# Patient Record
Sex: Female | Born: 1945
Health system: Southern US, Community
[De-identification: ages and names within clinical notes are randomized; demographics above are authoritative.]

## PROBLEM LIST (undated history)

## (undated) DIAGNOSIS — M1990 Unspecified osteoarthritis, unspecified site: Secondary | ICD-10-CM

## (undated) DIAGNOSIS — I10 Essential (primary) hypertension: Secondary | ICD-10-CM

## (undated) DIAGNOSIS — E785 Hyperlipidemia, unspecified: Secondary | ICD-10-CM

## (undated) DIAGNOSIS — J45909 Unspecified asthma, uncomplicated: Secondary | ICD-10-CM

## (undated) HISTORY — PX: CHOLECYSTECTOMY: SHX55

## (undated) HISTORY — PX: ABDOMINAL HYSTERECTOMY: SHX81

---

## 2005-02-20 ENCOUNTER — Encounter: Payer: Self-pay | Admitting: General Practice

## 2005-03-04 ENCOUNTER — Encounter: Payer: Self-pay | Admitting: General Practice

## 2005-03-13 ENCOUNTER — Ambulatory Visit: Payer: Self-pay

## 2005-04-03 ENCOUNTER — Encounter: Payer: Self-pay | Admitting: General Practice

## 2006-05-18 ENCOUNTER — Ambulatory Visit: Payer: Self-pay

## 2007-08-01 ENCOUNTER — Ambulatory Visit: Payer: Self-pay

## 2008-12-09 ENCOUNTER — Ambulatory Visit: Payer: Self-pay

## 2009-12-21 ENCOUNTER — Ambulatory Visit: Payer: Self-pay

## 2010-12-22 ENCOUNTER — Ambulatory Visit: Payer: Self-pay

## 2012-01-12 ENCOUNTER — Ambulatory Visit: Payer: Self-pay | Admitting: Internal Medicine

## 2013-01-15 ENCOUNTER — Ambulatory Visit: Payer: Self-pay | Admitting: Internal Medicine

## 2013-01-21 ENCOUNTER — Ambulatory Visit: Payer: Self-pay | Admitting: Internal Medicine

## 2014-01-21 ENCOUNTER — Ambulatory Visit: Payer: Self-pay | Admitting: Internal Medicine

## 2015-01-25 ENCOUNTER — Ambulatory Visit: Payer: Self-pay | Admitting: Internal Medicine

## 2016-01-06 DIAGNOSIS — I10 Essential (primary) hypertension: Secondary | ICD-10-CM | POA: Diagnosis not present

## 2016-01-13 ENCOUNTER — Other Ambulatory Visit: Payer: Self-pay | Admitting: Internal Medicine

## 2016-01-13 DIAGNOSIS — Z1231 Encounter for screening mammogram for malignant neoplasm of breast: Secondary | ICD-10-CM

## 2016-02-02 ENCOUNTER — Ambulatory Visit
Admission: RE | Admit: 2016-02-02 | Discharge: 2016-02-02 | Disposition: A | Discharge status: Home / Self Care | Payer: 59 | Source: Ambulatory Visit | Attending: Internal Medicine | Admitting: Internal Medicine

## 2016-02-02 ENCOUNTER — Other Ambulatory Visit: Payer: Self-pay | Admitting: Internal Medicine

## 2016-02-02 DIAGNOSIS — Z1231 Encounter for screening mammogram for malignant neoplasm of breast: Secondary | ICD-10-CM

## 2016-08-17 DIAGNOSIS — I119 Hypertensive heart disease without heart failure: Secondary | ICD-10-CM | POA: Diagnosis not present

## 2016-08-17 DIAGNOSIS — N3000 Acute cystitis without hematuria: Secondary | ICD-10-CM | POA: Diagnosis not present

## 2016-09-28 ENCOUNTER — Ambulatory Visit: Payer: Self-pay | Admitting: Physician Assistant

## 2016-09-28 ENCOUNTER — Encounter: Payer: Self-pay | Admitting: Physician Assistant

## 2016-09-28 VITALS — BP 178/80 | HR 72 | Temp 98.3°F

## 2016-09-28 DIAGNOSIS — J069 Acute upper respiratory infection, unspecified: Secondary | ICD-10-CM

## 2016-09-28 MED ORDER — AZITHROMYCIN 250 MG PO TABS: ORAL_TABLET | ORAL | 0 refills | Status: DC

## 2016-09-28 MED ORDER — FLUTICASONE PROPIONATE 50 MCG/ACT NA SUSP: 2.0000 | Freq: Every day | NASAL | 6 refills | Status: DC

## 2016-09-28 MED ORDER — ALBUTEROL SULFATE HFA 108 (90 BASE) MCG/ACT IN AERS: 2.0000 | INHALATION_SPRAY | Freq: Four times a day (QID) | RESPIRATORY_TRACT | 0 refills | Status: DC | PRN

## 2016-09-28 NOTE — Progress Notes (Signed)
S: C/o cough and congestion for 2 days, no fever, chills, cp/sob, v/d; mucus was white and thick , cough is sporadic, states last time she ended up on inhaler and flonase  Using otc meds: robitussin  O: PE: vitals w elevated bp, perrl eomi, normocephalic, tms dull, nasal mucosa red and swollen, throat injected, neck supple no lymph, lungs c t a, cv rrr, neuro intact, cough is dry  A:  Acute uri   P: drink fluids, continue regular meds , use otc meds of choice, return if not improving in 5 days, return earlier if worsening , albuterol inhaler, flonase, if worsening over the weekend use zpack

## 2017-01-11 DIAGNOSIS — I119 Hypertensive heart disease without heart failure: Secondary | ICD-10-CM | POA: Diagnosis not present

## 2017-01-11 DIAGNOSIS — E785 Hyperlipidemia, unspecified: Secondary | ICD-10-CM | POA: Diagnosis not present

## 2017-01-23 ENCOUNTER — Other Ambulatory Visit: Payer: Self-pay | Admitting: Internal Medicine

## 2017-01-23 DIAGNOSIS — Z1231 Encounter for screening mammogram for malignant neoplasm of breast: Secondary | ICD-10-CM

## 2017-02-21 ENCOUNTER — Encounter (HOSPITAL_COMMUNITY): Payer: Self-pay

## 2017-02-21 ENCOUNTER — Ambulatory Visit
Admission: RE | Admit: 2017-02-21 | Discharge: 2017-02-21 | Disposition: A | Discharge status: Home / Self Care | Payer: 59 | Source: Ambulatory Visit | Attending: Internal Medicine | Admitting: Internal Medicine

## 2017-02-21 DIAGNOSIS — Z1231 Encounter for screening mammogram for malignant neoplasm of breast: Secondary | ICD-10-CM | POA: Insufficient documentation

## 2017-07-01 ENCOUNTER — Emergency Department
Admission: EM | Admit: 2017-07-01 | Discharge: 2017-07-01 | Disposition: A | Discharge status: Home / Self Care | Payer: PRIVATE HEALTH INSURANCE | Attending: Emergency Medicine | Admitting: Emergency Medicine

## 2017-07-01 ENCOUNTER — Emergency Department: Payer: PRIVATE HEALTH INSURANCE

## 2017-07-01 ENCOUNTER — Encounter: Payer: Self-pay | Admitting: Emergency Medicine

## 2017-07-01 DIAGNOSIS — M25511 Pain in right shoulder: Secondary | ICD-10-CM | POA: Insufficient documentation

## 2017-07-01 DIAGNOSIS — M19011 Primary osteoarthritis, right shoulder: Secondary | ICD-10-CM | POA: Insufficient documentation

## 2017-07-01 DIAGNOSIS — Z79899 Other long term (current) drug therapy: Secondary | ICD-10-CM | POA: Diagnosis not present

## 2017-07-01 DIAGNOSIS — I10 Essential (primary) hypertension: Secondary | ICD-10-CM | POA: Diagnosis not present

## 2017-07-01 HISTORY — DX: Essential (primary) hypertension: I10

## 2017-07-01 HISTORY — DX: Unspecified osteoarthritis, unspecified site: M19.90

## 2017-07-01 MED ORDER — MELOXICAM 7.5 MG PO TABS: 7.5000 mg | ORAL_TABLET | Freq: Every day | ORAL | 0 refills | Status: DC

## 2017-07-01 NOTE — ED Notes (Signed)
[employer] Employer Profile printed and placed on chart; per profile, "[employer] testing is done for reasonable suspicion only."  This RN spoke with EVS Supervisor, Truddie Crumblelea Pulliam, he states no testing is necessary at this time.

## 2017-07-01 NOTE — Discharge Instructions (Signed)
Follow-up at the employee clinic or your primary care doctor if any continued problems. Begin taking meloxicam 7.5 mg one daily with food. Discontinue taking this medication if you have any stomach upset. You may use heat or ice to your shoulder as needed for comfort.

## 2017-07-01 NOTE — ED Triage Notes (Signed)
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

## 2017-07-01 NOTE — ED Provider Notes (Signed)
[facility] Emergency Department Provider Note  ____________________________________________   First MD Initiated Contact with Patient 07/01/17 1149     (approximate)  I have reviewed the triage vital signs and the nursing notes.   HISTORY  Chief Complaint Shoulder Pain    HPI Alyssa SlaterSallie L Baird is a 71 y.o. female is here with complaint of right shoulder pain. 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 She denies any previous known injury to her right shoulder. Patient is not taking any over-the-counter medication for her pain prior to examination. She denies any neck injuries or paresthesias into her right arm. Pain is increased with range of motion. Currently she rates her pain as 7/10.   Past Medical History:  Diagnosis Date  . Arthritis   . Hypertension     There are no active problems to display for this patient.   History reviewed. No pertinent surgical history.  Prior to Admission medications   Medication Sig Start Date End Date Taking? Authorizing Provider  albuterol (PROVENTIL HFA;VENTOLIN HFA) 108 (90 Base) MCG/ACT inhaler Inhale 2 puffs into the lungs every 6 (six) hours as needed for wheezing or shortness of breath. 09/28/16   Fisher, Roselyn BeringSusan W, PA-C  fluticasone (FLONASE) 50 MCG/ACT nasal spray Place 2 sprays into both nostrils daily. 09/28/16   Fisher, Roselyn BeringSusan W, PA-C  meloxicam (MOBIC) 7.5 MG tablet Take 1 tablet (7.5 mg total) by mouth daily. With food 07/01/17 07/01/18  Tommi RumpsSummers, Bridgitt Raggio L, PA-C  metoprolol succinate (TOPROL-XL) 100 MG 24 hr tablet Take 100 mg by mouth daily. Take with or immediately following a meal.    [provider]  metoprolol tartrate (LOPRESSOR) 25 MG tablet Take 25 mg by mouth 2 (two) times daily.    [provider]  valsartan (DIOVAN) 40 MG tablet Take 40 mg by mouth daily.    [provider]    Allergies Patient has no  allergy information on record.  Family History  Problem Relation Age of Onset  . Breast cancer Sister 1360    Social History Social History  Substance Use Topics  . Smoking status: Never Smoker  . Smokeless tobacco: Never Used  . Alcohol use No    Review of Systems Constitutional: No fever/chills Cardiovascular: Denies chest pain. Respiratory: Denies shortness of breath. Gastrointestinal:  No nausea, no vomiting.  Musculoskeletal: Positive right shoulder pain. Skin: Negative for rash. Neurological: Negative for headaches, focal weakness or numbness.   ____________________________________________   PHYSICAL EXAM:  VITAL SIGNS: ED Triage Vitals  Enc Vitals Group     BP 07/01/17 1127 (!) 167/60     Pulse Rate 07/01/17 1127 (!) 55     Resp 07/01/17 1127 18     Temp 07/01/17 1127 98.4 F (36.9 C)     Temp Source 07/01/17 1127 Oral     SpO2 07/01/17 1127 100 %     Weight 07/01/17 1128 178 lb (80.7 kg)     Height 07/01/17 1128 5\' 6"  (1.676 m)     Head Circumference --      Peak Flow --      Pain Score 07/01/17 1135 7     Pain Loc --      Pain Edu? --      Excl. in GC? --     Constitutional: Alert and oriented. Well appearing and in no acute distress. Eyes: Conjunctivae are normal. PERRL. EOMI. Head: Atraumatic. Neck: No stridor.  Cardiovascular: Normal rate, regular rhythm. Grossly normal heart sounds.  Good peripheral circulation. Respiratory: Normal respiratory effort.  No retractions. Lungs CTAB. Musculoskeletal: Examination of the right shoulder there is no gross deformity noted. There is minimal crepitus with range of motion and range of motion is minimally restricted with passive movement. No soft tissue swelling is appreciated. No point tenderness on palpation. Motor sensory function intact distally and capillary refill is less than 3 seconds. Neurologic:  Normal speech and language. No gross focal neurologic deficits are appreciated.  Skin:  Skin is warm, dry  and intact.  Psychiatric: Mood and affect are normal. Speech and behavior are normal.  ____________________________________________   LABS (all labs ordered are listed, but only abnormal results are displayed)  Labs Reviewed - No data to display  RADIOLOGY  Dg Shoulder Right  Result Date: 07/01/2017 CLINICAL DATA:  Anterior shoulder pain following lifting injury today. No previous relevant surgery. Initial encounter. EXAM: RIGHT SHOULDER - 2+ VIEW COMPARISON:  None. FINDINGS: The bones appear mildly demineralized. No evidence of acute fracture or dislocation. There are mild glenohumeral and acromioclavicular degenerative changes with probable fragmented spurring of the acromion. The subacromial space appears narrowed although is not optimally evaluated on these views. IMPRESSION: No acute osseous findings demonstrated. Suspected narrowing of the subacromial space as can be seen with rotator cuff impingement. Electronically Signed   By: Carey BullocksWilliam  Veazey M.D.   On: 07/01/2017 12:08    ____________________________________________   PROCEDURES  Procedure(s) performed: None  Procedures  Critical Care performed: No  ____________________________________________   INITIAL IMPRESSION / ASSESSMENT AND PLAN / ED COURSE  Pertinent labs & imaging results that were available during my care of the patient were reviewed by me and considered in my medical decision making (see chart for details).  Patient was placed on meloxicam 7.5 mg one daily with food. She is aware that this causes any GI upset she is to discontinue the medication immediately. She denies any previous GI difficulties other than reflux. Patient was given a note to return to work with limited use of her right arm the remainder of her shift. Patient states that she is off tomorrow. She will follow up with the employee clinic or her PCP if any continued problems.   ___________________________________________   FINAL CLINICAL  IMPRESSION(S) / ED DIAGNOSES  Final diagnoses:  Acute pain of right shoulder  Osteoarthritis of right shoulder, unspecified osteoarthritis type      NEW MEDICATIONS STARTED DURING THIS VISIT:  Discharge Medication List as of 07/01/2017 12:56 PM    START taking these medications   Details  meloxicam (MOBIC) 7.5 MG tablet Take 1 tablet (7.5 mg total) by mouth daily. With food, Starting Sun 07/01/2017, Until Mon 07/01/2018, Print         Note:  This document was prepared using Dragon voice recognition software and may include unintentional dictation errors.    Tommi RumpsSummers, Dontavis Tschantz L, PA-C 07/01/17 1331    Jeanmarie PlantMcShane, James A, MD 07/01/17 1438

## 2017-08-20 DIAGNOSIS — H524 Presbyopia: Secondary | ICD-10-CM | POA: Diagnosis not present

## 2017-10-09 DIAGNOSIS — E782 Mixed hyperlipidemia: Secondary | ICD-10-CM | POA: Diagnosis not present

## 2017-10-09 DIAGNOSIS — I119 Hypertensive heart disease without heart failure: Secondary | ICD-10-CM | POA: Diagnosis not present

## 2017-10-09 DIAGNOSIS — K5900 Constipation, unspecified: Secondary | ICD-10-CM | POA: Diagnosis not present

## 2017-12-18 DIAGNOSIS — H40153 Residual stage of open-angle glaucoma, bilateral: Secondary | ICD-10-CM | POA: Diagnosis not present

## 2018-01-02 ENCOUNTER — Encounter: Payer: Self-pay | Admitting: Psychiatry

## 2018-01-02 ENCOUNTER — Ambulatory Visit (INDEPENDENT_AMBULATORY_CARE_PROVIDER_SITE_OTHER): Payer: Self-pay | Admitting: Psychiatry

## 2018-01-02 VITALS — BP 145/81 | HR 63 | Temp 98.3°F | Wt 178.0 lb

## 2018-01-02 DIAGNOSIS — J208 Acute bronchitis due to other specified organisms: Secondary | ICD-10-CM

## 2018-01-02 MED ORDER — ALBUTEROL SULFATE HFA 108 (90 BASE) MCG/ACT IN AERS: 2.0000 | INHALATION_SPRAY | Freq: Four times a day (QID) | RESPIRATORY_TRACT | 0 refills | Status: AC | PRN

## 2018-01-02 MED ORDER — AMOXICILLIN-POT CLAVULANATE 875-125 MG PO TABS: 1.0000 | ORAL_TABLET | Freq: Two times a day (BID) | ORAL | 0 refills | Status: DC

## 2018-01-02 MED ORDER — BENZONATATE 100 MG PO CAPS: 100.0000 mg | ORAL_CAPSULE | Freq: Three times a day (TID) | ORAL | 0 refills | Status: DC | PRN

## 2018-01-02 MED ORDER — PREDNISONE 10 MG PO TABS: 5.0000 mg | ORAL_TABLET | Freq: Every day | ORAL | 0 refills | Status: AC

## 2018-01-02 NOTE — Patient Instructions (Signed)

## 2018-01-02 NOTE — Progress Notes (Signed)
  Subjective:     Alyssa Baird is a 72 y.o. female here for evaluation of a cough. Onset of symptoms was 4 days ago. Symptoms have been gradually worsening since that time. The cough is productive of yellow sputum and is aggravated by  unknown. Associated symptoms include: head and chest congestion. Patient does have a history of asthma. Patient does not have a history of environmental allergens. Patient has not traveled recently. Patient does not have a history of smoking. Denies any fever. Have tried robitussin DM and it was mildly helpful.  The following portions of the patient's history were reviewed and updated as appropriate: allergies, current medications, past family history, past medical history, past social history, past surgical history and problem list.  Review of Systems Pertinent items are noted in HPI.    Objective:    General appearance: alert, cooperative and no distress Head: Normocephalic, without obvious abnormality, atraumatic Eyes: conjunctivae/corneas clear. PERRL, EOM's intact. Fundi benign. Ears: abnormal TM left ear - impacted with rusty brown cerumen Nose: right turbinate red, inflamed, left turbinate red, inflamed, polyps to the right nare Throat: lips, mucosa, and tongue normal; teeth and gums normal Neck: no adenopathy, no carotid bruit, no JVD, supple, symmetrical, trachea midline and thyroid not enlarged, symmetric, no tenderness/mass/nodules Lungs: wheezes apex - right Heart: regular rate and rhythm, S1, S2 normal, no murmur, click, rub or gallop    Assessment:    Acute Bronchitis   Alyssa Baird was seen today for choice-sinus pain-cough-yellow mucus x4d.  Diagnoses and all orders for this visit:  Acute bronchitis due to other specified organisms -     benzonatate (TESSALON) 100 MG capsule; Take 1 capsule (100 mg total) by mouth 3 (three) times daily as needed for cough.  Other orders -     predniSONE (DELTASONE) 10 MG tablet; Take 0.5 tablets (5 mg  total) by mouth daily with breakfast for 5 days. -     albuterol (PROVENTIL HFA;VENTOLIN HFA) 108 (90 Base) MCG/ACT inhaler; Inhale 2 puffs into the lungs every 6 (six) hours as needed for wheezing or shortness of breath. -     amoxicillin-clavulanate (AUGMENTIN) 875-125 MG tablet; Take 1 tablet by mouth 2 (two) times daily.      Plan:    Antibiotics per medication orders. Antitussives per medication orders. Avoid exposure to tobacco smoke and fumes. B-agonist inhaler. Call if shortness of breath worsens, blood in sputum, change in character of cough, development of fever or chills, inability to maintain nutrition and hydration. Avoid exposure to tobacco smoke and fumes.

## 2018-01-30 ENCOUNTER — Other Ambulatory Visit: Payer: Self-pay | Admitting: Internal Medicine

## 2018-01-30 DIAGNOSIS — Z1231 Encounter for screening mammogram for malignant neoplasm of breast: Secondary | ICD-10-CM

## 2018-02-01 ENCOUNTER — Other Ambulatory Visit: Payer: Self-pay | Admitting: Orthopedic Surgery

## 2018-02-01 DIAGNOSIS — M25511 Pain in right shoulder: Secondary | ICD-10-CM

## 2018-02-14 ENCOUNTER — Ambulatory Visit
Admission: RE | Admit: 2018-02-14 | Discharge: 2018-02-14 | Disposition: A | Discharge status: Home / Self Care | Payer: PRIVATE HEALTH INSURANCE | Source: Ambulatory Visit | Attending: Orthopedic Surgery | Admitting: Orthopedic Surgery

## 2018-02-14 DIAGNOSIS — M25511 Pain in right shoulder: Secondary | ICD-10-CM | POA: Insufficient documentation

## 2018-02-14 DIAGNOSIS — M19011 Primary osteoarthritis, right shoulder: Secondary | ICD-10-CM | POA: Diagnosis not present

## 2018-02-14 DIAGNOSIS — M75121 Complete rotator cuff tear or rupture of right shoulder, not specified as traumatic: Secondary | ICD-10-CM | POA: Insufficient documentation

## 2018-02-27 ENCOUNTER — Ambulatory Visit
Admission: RE | Admit: 2018-02-27 | Discharge: 2018-02-27 | Disposition: A | Discharge status: Home / Self Care | Payer: 59 | Source: Ambulatory Visit | Attending: Internal Medicine | Admitting: Internal Medicine

## 2018-02-27 DIAGNOSIS — Z1231 Encounter for screening mammogram for malignant neoplasm of breast: Secondary | ICD-10-CM | POA: Diagnosis not present

## 2018-05-06 ENCOUNTER — Ambulatory Visit: Payer: Self-pay | Admitting: Orthopedic Surgery

## 2018-05-23 ENCOUNTER — Other Ambulatory Visit: Payer: Self-pay

## 2018-05-23 ENCOUNTER — Encounter
Admission: RE | Admit: 2018-05-23 | Discharge: 2018-05-23 | Disposition: A | Discharge status: Home / Self Care | Payer: PRIVATE HEALTH INSURANCE | Source: Ambulatory Visit | Attending: Orthopedic Surgery | Admitting: Orthopedic Surgery

## 2018-05-23 DIAGNOSIS — R9431 Abnormal electrocardiogram [ECG] [EKG]: Secondary | ICD-10-CM | POA: Insufficient documentation

## 2018-05-23 DIAGNOSIS — I10 Essential (primary) hypertension: Secondary | ICD-10-CM | POA: Diagnosis not present

## 2018-05-23 DIAGNOSIS — Z01812 Encounter for preprocedural laboratory examination: Secondary | ICD-10-CM | POA: Diagnosis present

## 2018-05-23 DIAGNOSIS — Z01810 Encounter for preprocedural cardiovascular examination: Secondary | ICD-10-CM | POA: Insufficient documentation

## 2018-05-23 HISTORY — DX: Hyperlipidemia, unspecified: E78.5

## 2018-05-23 HISTORY — DX: Unspecified asthma, uncomplicated: J45.909

## 2018-05-23 LAB — CBC
HCT: 39 % (ref 35.0–47.0)
Hemoglobin: 12.9 g/dL (ref 12.0–16.0)
MCH: 30.6 pg (ref 26.0–34.0)
MCHC: 33 g/dL (ref 32.0–36.0)
MCV: 92.5 fL (ref 80.0–100.0)
Platelets: 150 10*3/uL (ref 150–440)
RBC: 4.21 MIL/uL (ref 3.80–5.20)
RDW: 14.1 % (ref 11.5–14.5)
WBC: 6.7 10*3/uL (ref 3.6–11.0)

## 2018-05-23 LAB — BASIC METABOLIC PANEL
Anion gap: 10 (ref 5–15)
BUN: 24 mg/dL — ABNORMAL HIGH (ref 6–20)
CO2: 26 mmol/L (ref 22–32)
Calcium: 8.9 mg/dL (ref 8.9–10.3)
Chloride: 106 mmol/L (ref 101–111)
Creatinine, Ser: 0.95 mg/dL (ref 0.44–1.00)
GFR calc Af Amer: >60 mL/min (ref >60)
GFR calc non Af Amer: 58 mL/min — ABNORMAL LOW (ref >60)
Glucose, Bld: 137 mg/dL — ABNORMAL HIGH (ref 65–99)
Potassium: 3.3 mmol/L — ABNORMAL LOW (ref 3.5–5.1)
Sodium: 142 mmol/L (ref 135–145)

## 2018-05-23 LAB — URINALYSIS, ROUTINE W REFLEX MICROSCOPIC
Bilirubin Urine: NEGATIVE
Glucose, UA: NEGATIVE mg/dL
Hgb urine dipstick: NEGATIVE
Ketones, ur: NEGATIVE mg/dL
Leukocytes, UA: NEGATIVE
Nitrite: NEGATIVE
Protein, ur: NEGATIVE mg/dL
Specific Gravity, Urine: 1.02 (ref 1.005–1.030)
pH: 5 (ref 5.0–8.0)

## 2018-05-23 LAB — APTT: aPTT: 25 seconds (ref 24–36)

## 2018-05-23 LAB — PROTIME-INR
INR: 0.89
Prothrombin Time: 12 seconds (ref 11.4–15.2)

## 2018-05-23 NOTE — Pre-Procedure Instructions (Signed)
EKG/REQUEST FOR CLEARANCE CALLED AND FAXED TO PCP AND FYI TO TIFFANY AT DR Odis LusterBOWERS

## 2018-05-23 NOTE — Patient Instructions (Signed)
Your procedure is scheduled on:Wed. 6/26 Report to Day Surgery. To find out your arrival time please call [phone] between 1PM - 3PM on Tues 6/25.  Remember: Instructions that are not followed completely may result in serious medical risk, up to and including death, or upon the discretion of your surgeon and anesthesiologist your surgery may need to be rescheduled.     _X__ 1. Do not eat food after midnight the night before your procedure.                 No gum chewing or hard candies. You may drink clear liquids up to 2 hours                 before you are scheduled to arrive for your surgery- DO not drink clear                 liquids within 2 hours of the start of your surgery.                 Clear Liquids include:  water, apple juice without pulp, clear carbohydrate                 drink such as Clearfast of Gartorade, Black Coffee or Tea (Do not add                 anything to coffee or tea).  __X__2.  On the morning of surgery brush your teeth with toothpaste and water, you may rinse your mouth with mouthwash if you wish.  Do not swallow any              toothpaste of mouthwash.     _X__ 3.  No Alcohol for 24 hours before or after surgery.   ___ 4.  Do Not Smoke or use e-cigarettes For 24 Hours Prior to Your Surgery.                 Do not use any chewable tobacco products for at least 6 hours prior to                 surgery.  ____  5.  Bring all medications with you on the day of surgery if instructed.   _x___  6.  Notify your doctor if there is any change in your medical condition      (cold, fever, infections).     Do not wear jewelry, make-up, hairpins, clips or nail polish. Do not wear lotions, powders, or perfumes. You may wear deodorant. Do not shave 48 hours prior to surgery. Men may shave face and neck. Do not bring valuables to the hospital.    [facility] is not responsible for any belongings or valuables.  Contacts, dentures or  bridgework may not be worn into surgery. Leave your suitcase in the car. After surgery it may be brought to your room. For patients admitted to the hospital, discharge time is determined by your treatment team.   Patients discharged the day of surgery will not be allowed to drive home.   Please read over the following fact sheets that you were given:    __x__ Take these medicines the morning of surgery with A SIP OF WATER:    1. metoprolol tartrate (LOPRESSOR) 50 MG tablet  2. omeprazole (PRILOSEC) 40 MG capsule take extra dose the night before the surgery and the morning of the surgery  3.   4.  5.  6.  ____ Fleet Enema (  as directed)  x ____ Use CHG Soap as directed  ___x_ Use inhalers on the day of surgery albuterol (PROVENTIL HFA;VENTOLIN HFA) 108 (90 Base) MCG/ACT inhaler and bring with you to the hospital  ____ Stop metformin 2 days prior to surgery    ____ Take 1/2 of usual insulin dose the night before surgery. No insulin the morning          of surgery.   __x__ Stop aspirin  aspirin EC 81 MG tablet today  ____ Stop Anti-inflammatories meloxicam (MOBIC) 15 MG tablet today   __x__ Stop supplements until after surgery. vitamin C (ASCORBIC ACID) 500 MG tablet   ____ Bring C-Pap to the hospital.

## 2018-05-27 NOTE — Pre-Procedure Instructions (Addendum)
NO RESPONSE TO MESSAGE LEFT FOR JULIA JOHNSON 05/23/18. FAXED REQUEST INFO TO DR E EASON'S OFFICE  SPOKE WITH JULIA JOHNSON NP. SHE IS REFERRING PATIENT TO CARDIOLOGIST. WILL CALL BACK WITH INFO. NOTIFIED Keondria Siever AT DR BOWER'S/ LM

## 2018-05-28 NOTE — Pre-Procedure Instructions (Addendum)
SPOKE WITH Alyssa Baird MORTON AT DR Odis LusterBOWERS AND LM FOR Alyssa ClaraJULIE JOHNSON FNP REQUESTING UPDATE ON CLEARANCE PROCESS Irini Leet MORTON CALLED. PATIENT SEEING FNP AT 1030

## 2018-06-04 ENCOUNTER — Other Ambulatory Visit: Payer: Self-pay | Admitting: Internal Medicine

## 2018-06-04 DIAGNOSIS — I208 Other forms of angina pectoris: Secondary | ICD-10-CM

## 2018-06-11 ENCOUNTER — Ambulatory Visit
Admission: RE | Admit: 2018-06-11 | Discharge: 2018-06-11 | Disposition: A | Discharge status: Home / Self Care | Payer: PRIVATE HEALTH INSURANCE | Source: Ambulatory Visit | Attending: Orthopedic Surgery | Admitting: Orthopedic Surgery

## 2018-06-11 ENCOUNTER — Encounter
Admission: RE | Disposition: A | Discharge status: Home / Self Care | Payer: Self-pay | Source: Ambulatory Visit | Attending: Orthopedic Surgery

## 2018-06-11 ENCOUNTER — Other Ambulatory Visit: Payer: Self-pay

## 2018-06-11 ENCOUNTER — Ambulatory Visit: Payer: PRIVATE HEALTH INSURANCE | Admitting: Anesthesiology

## 2018-06-11 DIAGNOSIS — Z79899 Other long term (current) drug therapy: Secondary | ICD-10-CM | POA: Insufficient documentation

## 2018-06-11 DIAGNOSIS — M75101 Unspecified rotator cuff tear or rupture of right shoulder, not specified as traumatic: Secondary | ICD-10-CM | POA: Diagnosis not present

## 2018-06-11 DIAGNOSIS — J45909 Unspecified asthma, uncomplicated: Secondary | ICD-10-CM | POA: Diagnosis not present

## 2018-06-11 DIAGNOSIS — I10 Essential (primary) hypertension: Secondary | ICD-10-CM | POA: Diagnosis not present

## 2018-06-11 DIAGNOSIS — E785 Hyperlipidemia, unspecified: Secondary | ICD-10-CM | POA: Diagnosis not present

## 2018-06-11 HISTORY — PX: SHOULDER ARTHROSCOPY WITH ROTATOR CUFF REPAIR: SHX5685

## 2018-06-11 SURGERY — ARTHROSCOPY, SHOULDER, WITH ROTATOR CUFF REPAIR
Anesthesia: General | Site: Shoulder | Laterality: Right

## 2018-06-11 MED ORDER — CEFAZOLIN SODIUM-DEXTROSE 2-4 GM/100ML-% IV SOLN
INTRAVENOUS | Status: AC
  Filled 2018-06-11: qty 100

## 2018-06-11 MED ORDER — DEXAMETHASONE SODIUM PHOSPHATE 10 MG/ML IJ SOLN
INTRAMUSCULAR | Status: DC | PRN
  Administered 2018-06-11: 5 mg via INTRAVENOUS

## 2018-06-11 MED ORDER — CHLORHEXIDINE GLUCONATE 4 % EX LIQD: 60.0000 mL | Freq: Once | CUTANEOUS | Status: DC

## 2018-06-11 MED ORDER — GLYCOPYRROLATE 0.2 MG/ML IJ SOLN
INTRAMUSCULAR | Status: DC | PRN
  Administered 2018-06-11: 0.2 mg via INTRAVENOUS

## 2018-06-11 MED ORDER — BUPIVACAINE HCL (PF) 0.5 % IJ SOLN
INTRAMUSCULAR | Status: AC
  Filled 2018-06-11: qty 10

## 2018-06-11 MED ORDER — DEXAMETHASONE SODIUM PHOSPHATE 10 MG/ML IJ SOLN
INTRAMUSCULAR | Status: AC
Start: 2018-06-11 — End: ?
  Filled 2018-06-11: qty 1

## 2018-06-11 MED ORDER — LACTATED RINGERS IV SOLN
INTRAVENOUS | Status: DC
  Administered 2018-06-11 (×2): via INTRAVENOUS

## 2018-06-11 MED ORDER — PHENYLEPHRINE HCL 10 MG/ML IJ SOLN
INTRAMUSCULAR | Status: DC | PRN
  Administered 2018-06-11: 25 ug/min via INTRAVENOUS

## 2018-06-11 MED ORDER — LIDOCAINE HCL (PF) 1 % IJ SOLN
INTRAMUSCULAR | Status: AC
  Filled 2018-06-11: qty 5

## 2018-06-11 MED ORDER — PHENYLEPHRINE HCL 10 MG/ML IJ SOLN
INTRAMUSCULAR | Status: DC | PRN
  Administered 2018-06-11: 100 ug via INTRAVENOUS

## 2018-06-11 MED ORDER — EPHEDRINE SULFATE 50 MG/ML IJ SOLN
INTRAMUSCULAR | Status: DC | PRN
  Administered 2018-06-11: 10 mg via INTRAVENOUS
  Administered 2018-06-11: 5 mg via INTRAVENOUS

## 2018-06-11 MED ORDER — LACTATED RINGERS IR SOLN
Status: DC | PRN
  Administered 2018-06-11: 9000 mL

## 2018-06-11 MED ORDER — ONDANSETRON HCL 4 MG/2ML IJ SOLN
INTRAMUSCULAR | Status: DC | PRN
  Administered 2018-06-11: 4 mg via INTRAVENOUS

## 2018-06-11 MED ORDER — ONDANSETRON HCL 4 MG/2ML IJ SOLN
INTRAMUSCULAR | Status: AC
  Filled 2018-06-11: qty 2

## 2018-06-11 MED ORDER — ROCURONIUM BROMIDE 100 MG/10ML IV SOLN
INTRAVENOUS | Status: DC | PRN
  Administered 2018-06-11: 50 mg via INTRAVENOUS

## 2018-06-11 MED ORDER — DOCUSATE SODIUM 100 MG PO CAPS: 100.0000 mg | ORAL_CAPSULE | Freq: Every day | ORAL | 2 refills | Status: AC | PRN

## 2018-06-11 MED ORDER — ACETAMINOPHEN 500 MG PO TABS
1000.0000 mg | ORAL_TABLET | Freq: Once | ORAL | Status: AC
  Administered 2018-06-11: 1000 mg via ORAL

## 2018-06-11 MED ORDER — FENTANYL CITRATE (PF) 100 MCG/2ML IJ SOLN
INTRAMUSCULAR | Status: DC | PRN
  Administered 2018-06-11: 50 ug via INTRAVENOUS

## 2018-06-11 MED ORDER — HYDROCODONE-ACETAMINOPHEN 5-325 MG PO TABS: 1.0000 | ORAL_TABLET | ORAL | 0 refills | Status: AC | PRN

## 2018-06-11 MED ORDER — LIDOCAINE HCL (PF) 2 % IJ SOLN
INTRAMUSCULAR | Status: AC
  Filled 2018-06-11: qty 10

## 2018-06-11 MED ORDER — FENTANYL CITRATE (PF) 100 MCG/2ML IJ SOLN
50.0000 ug | Freq: Once | INTRAMUSCULAR | Status: AC
  Administered 2018-06-11: 50 ug via INTRAVENOUS

## 2018-06-11 MED ORDER — SUCCINYLCHOLINE CHLORIDE 20 MG/ML IJ SOLN
INTRAMUSCULAR | Status: AC
  Filled 2018-06-11: qty 1

## 2018-06-11 MED ORDER — EPINEPHRINE 30 MG/30ML IJ SOLN
INTRAMUSCULAR | Status: DC | PRN
  Administered 2018-06-11: 4 mg

## 2018-06-11 MED ORDER — GABAPENTIN 300 MG PO CAPS
300.0000 mg | ORAL_CAPSULE | Freq: Once | ORAL | Status: AC
  Administered 2018-06-11: 300 mg via ORAL

## 2018-06-11 MED ORDER — ACETAMINOPHEN 500 MG PO TABS
ORAL_TABLET | ORAL | Status: AC
  Filled 2018-06-11: qty 2

## 2018-06-11 MED ORDER — ROCURONIUM BROMIDE 50 MG/5ML IV SOLN
INTRAVENOUS | Status: AC
  Filled 2018-06-11: qty 1

## 2018-06-11 MED ORDER — FENTANYL CITRATE (PF) 100 MCG/2ML IJ SOLN
INTRAMUSCULAR | Status: AC
  Administered 2018-06-11: 50 ug via INTRAVENOUS
  Filled 2018-06-11: qty 2

## 2018-06-11 MED ORDER — MIDAZOLAM HCL 2 MG/2ML IJ SOLN
INTRAMUSCULAR | Status: AC
  Filled 2018-06-11: qty 2

## 2018-06-11 MED ORDER — GLYCOPYRROLATE 0.2 MG/ML IJ SOLN
INTRAMUSCULAR | Status: AC
  Filled 2018-06-11: qty 1

## 2018-06-11 MED ORDER — FENTANYL CITRATE (PF) 100 MCG/2ML IJ SOLN
INTRAMUSCULAR | Status: AC
  Filled 2018-06-11: qty 2

## 2018-06-11 MED ORDER — DEXAMETHASONE SODIUM PHOSPHATE 10 MG/ML IJ SOLN
INTRAMUSCULAR | Status: AC
  Filled 2018-06-11: qty 1

## 2018-06-11 MED ORDER — OXYCODONE HCL 5 MG PO TABS: 5.0000 mg | ORAL_TABLET | Freq: Once | ORAL | Status: DC | PRN

## 2018-06-11 MED ORDER — BUPIVACAINE LIPOSOME 1.3 % IJ SUSP
INTRAMUSCULAR | Status: AC
  Filled 2018-06-11: qty 20

## 2018-06-11 MED ORDER — LIDOCAINE HCL (CARDIAC) PF 100 MG/5ML IV SOSY
PREFILLED_SYRINGE | INTRAVENOUS | Status: DC | PRN
  Administered 2018-06-11: 60 mg via INTRAVENOUS

## 2018-06-11 MED ORDER — MIDAZOLAM HCL 2 MG/2ML IJ SOLN
INTRAMUSCULAR | Status: AC
  Administered 2018-06-11: 1 mg via INTRAVENOUS
  Filled 2018-06-11: qty 2

## 2018-06-11 MED ORDER — PHENYLEPHRINE HCL 10 MG/ML IJ SOLN
INTRAMUSCULAR | Status: AC
  Filled 2018-06-11: qty 1

## 2018-06-11 MED ORDER — GABAPENTIN 300 MG PO CAPS
ORAL_CAPSULE | ORAL | Status: AC
  Filled 2018-06-11: qty 1

## 2018-06-11 MED ORDER — MIDAZOLAM HCL 2 MG/2ML IJ SOLN
INTRAMUSCULAR | Status: DC | PRN
  Administered 2018-06-11: 2 mg via INTRAVENOUS

## 2018-06-11 MED ORDER — MIDAZOLAM HCL 2 MG/2ML IJ SOLN
1.0000 mg | Freq: Once | INTRAMUSCULAR | Status: AC
  Administered 2018-06-11: 1 mg via INTRAVENOUS

## 2018-06-11 MED ORDER — SUGAMMADEX SODIUM 200 MG/2ML IV SOLN
INTRAVENOUS | Status: DC | PRN
  Administered 2018-06-11: 150 mg via INTRAVENOUS

## 2018-06-11 MED ORDER — PROPOFOL 10 MG/ML IV BOLUS
INTRAVENOUS | Status: DC | PRN
  Administered 2018-06-11: 100 mg via INTRAVENOUS
  Administered 2018-06-11: 50 mg via INTRAVENOUS

## 2018-06-11 MED ORDER — OXYCODONE HCL 5 MG/5ML PO SOLN: 5.0000 mg | Freq: Once | ORAL | Status: DC | PRN

## 2018-06-11 MED ORDER — CEFAZOLIN SODIUM-DEXTROSE 2-4 GM/100ML-% IV SOLN
2.0000 g | INTRAVENOUS | Status: AC
  Administered 2018-06-11: 2 g via INTRAVENOUS

## 2018-06-11 MED ORDER — PROPOFOL 10 MG/ML IV BOLUS
INTRAVENOUS | Status: AC
  Filled 2018-06-11: qty 20

## 2018-06-11 MED ORDER — FENTANYL CITRATE (PF) 100 MCG/2ML IJ SOLN: 25.0000 ug | INTRAMUSCULAR | Status: DC | PRN

## 2018-06-11 SURGICAL SUPPLY — 70 items
ADAPTER IRRIG TUBE 2 SPIKE SOL (ADAPTER) ×4 IMPLANT
ANCHOR SUT BIO SW 4.75X19.1 (Anchor) ×2 IMPLANT
BINDER ABDOMINAL 12 ML 46-62 (SOFTGOODS) ×2 IMPLANT
BLADE FULL RADIUS 3.5 (BLADE) ×2 IMPLANT
BLADE INCISOR PLUS 4.5 (BLADE) ×2 IMPLANT
BLADE SURG MINI STRL (BLADE) ×2 IMPLANT
BRUSH SCRUB EZ  4% CHG (MISCELLANEOUS) ×1
BRUSH SCRUB EZ 4% CHG (MISCELLANEOUS) ×1 IMPLANT
BUR BR 5.5 WIDE MOUTH (BURR) IMPLANT
CANNULA SHOULDER 7CM (CANNULA) ×4 IMPLANT
CANNULA TWIST IN 8.25X7CM (CANNULA) ×4 IMPLANT
CHLORAPREP W/TINT 26ML (MISCELLANEOUS) ×2 IMPLANT
COOLER POLAR GLACIER W/PUMP (MISCELLANEOUS) ×2 IMPLANT
CRADLE LAMINECT ARM (MISCELLANEOUS) ×2 IMPLANT
DEVICE SUCT BLK HOLE OR FLOOR (MISCELLANEOUS) ×2 IMPLANT
DRAPE IMP U-DRAPE 54X76 (DRAPES) ×4 IMPLANT
DRAPE INCISE IOBAN 66X45 STRL (DRAPES) ×2 IMPLANT
DRAPE SHEET LG 3/4 BI-LAMINATE (DRAPES) ×2 IMPLANT
DRAPE STERI 35X30 U-POUCH (DRAPES) ×2 IMPLANT
DRAPE U-SHAPE 47X51 STRL (DRAPES) ×2 IMPLANT
ELECT REM PT RETURN 9FT ADLT (ELECTROSURGICAL) ×2
ELECTRODE REM PT RTRN 9FT ADLT (ELECTROSURGICAL) ×1 IMPLANT
FIBER TAPE 2MM (SUTURE) ×2 IMPLANT
GAUZE PETRO XEROFOAM 1X8 (MISCELLANEOUS) ×2 IMPLANT
GAUZE SPONGE 4X4 12PLY STRL (GAUZE/BANDAGES/DRESSINGS) ×2 IMPLANT
GAUZE XEROFORM 4X4 STRL (GAUZE/BANDAGES/DRESSINGS) ×2 IMPLANT
GLOVE BIOGEL M 7.0 STRL (GLOVE) ×4 IMPLANT
GLOVE BIOGEL M 8.0 STRL (GLOVE) ×2 IMPLANT
GLOVE BIOGEL PI IND STRL 7.5 (GLOVE) ×3 IMPLANT
GLOVE BIOGEL PI IND STRL 8 (GLOVE) ×1 IMPLANT
GLOVE BIOGEL PI INDICATOR 7.5 (GLOVE) ×3
GLOVE BIOGEL PI INDICATOR 8 (GLOVE) ×1
GLOVE SURG ORTHO 8.0 STRL STRW (GLOVE) ×2 IMPLANT
GOWN STRL REUS W/ TWL LRG LVL3 (GOWN DISPOSABLE) ×1 IMPLANT
GOWN STRL REUS W/ TWL XL LVL3 (GOWN DISPOSABLE) ×1 IMPLANT
GOWN STRL REUS W/TWL LRG LVL3 (GOWN DISPOSABLE) ×1
GOWN STRL REUS W/TWL XL LVL3 (GOWN DISPOSABLE) ×1
IV LACTATED RINGER IRRG 3000ML (IV SOLUTION) ×6
IV LR IRRIG 3000ML ARTHROMATIC (IV SOLUTION) ×6 IMPLANT
KIT STABILIZATION SHOULDER (MISCELLANEOUS) ×2 IMPLANT
KIT TURNOVER KIT A (KITS) ×2 IMPLANT
MANIFOLD NEPTUNE II (INSTRUMENTS) ×4 IMPLANT
MASK FACE SPIDER DISP (MASK) ×2 IMPLANT
MAT BLUE FLOOR 46X72 FLO (MISCELLANEOUS) ×2 IMPLANT
NDL SAFETY ECLIPSE 18X1.5 (NEEDLE) ×1 IMPLANT
NEEDLE HYPO 18GX1.5 SHARP (NEEDLE) ×1
NEEDLE HYPO 22GX1.5 SAFETY (NEEDLE) ×2 IMPLANT
NEEDLE SCORPION MULTI FIRE (NEEDLE) IMPLANT
NEEDLE SPNL 18GX3.5 QUINCKE PK (NEEDLE) ×2 IMPLANT
PACK ARTHROSCOPY SHOULDER (MISCELLANEOUS) ×2 IMPLANT
PAD ABD DERMACEA PRESS 5X9 (GAUZE/BANDAGES/DRESSINGS) IMPLANT
PAD WRAPON POLAR SHDR XLG (MISCELLANEOUS) ×1 IMPLANT
SLING ARM LRG DEEP (SOFTGOODS) IMPLANT
SLING ULTRA II M (MISCELLANEOUS) ×2 IMPLANT
SPONGE XRAY 4X4 16PLY STRL (MISCELLANEOUS) IMPLANT
STRAP SAFETY 5IN WIDE (MISCELLANEOUS) ×2 IMPLANT
STRIP CLOSURE SKIN 1/4X4 (GAUZE/BANDAGES/DRESSINGS) IMPLANT
SUT ETHILON NAB PS2 4-0 18IN (SUTURE) ×2 IMPLANT
SUT FIBERWIRE #2 38 T-5 BLUE (SUTURE)
SUT LASSO 90 DEG CVD (SUTURE) ×2 IMPLANT
SUT PDS AB 0 CT1 27 (SUTURE) ×2 IMPLANT
SUT TIGER TAPE 7 IN WHITE (SUTURE) IMPLANT
SUTURE FIBERWR #2 38 T-5 BLUE (SUTURE) IMPLANT
SYR 10ML LL (SYRINGE) ×2 IMPLANT
SYR 50ML LL SCALE MARK (SYRINGE) ×2 IMPLANT
TAPE MICROFOAM 4IN (TAPE) ×2 IMPLANT
TUBING ARTHRO INFLOW-ONLY STRL (TUBING) ×2 IMPLANT
TUBING CONNECTING 10 (TUBING) ×2 IMPLANT
WAND HAND CNTRL MULTIVAC 90 (MISCELLANEOUS) ×2 IMPLANT
WRAPON POLAR PAD SHDR XLG (MISCELLANEOUS) ×2

## 2018-06-11 NOTE — Progress Notes (Signed)
Spoke with Dr. Maisie Fushomas about patients O2 level on room air ranging from 86-90. Encouraging patient to use incentive spirometer for the next hour and reevaluate.

## 2018-06-11 NOTE — Transfer of Care (Signed)
Immediate Anesthesia Transfer of Care Note  Patient: Alyssa SlaterSallie L Baird  Procedure(s) Performed: SHOULDER ARTHROSCOPY WITH ROTATOR CUFF REPAIR (Right Shoulder)  Patient Location: PACU  Anesthesia Type:General  Level of Consciousness: drowsy and patient cooperative  Airway & Oxygen Therapy: Patient Spontanous Breathing and Patient connected to face mask oxygen  Post-op Assessment: Report given to RN, Post -op Vital signs reviewed and stable and Patient moving all extremities  Post vital signs: Reviewed and stable  Last Vitals:  Vitals Value Taken Time  BP 158/71 06/11/2018  1:50 PM  Temp 36.2 C 06/11/2018  1:50 PM  Pulse 78 06/11/2018  1:50 PM  Resp 23 06/11/2018  1:50 PM  SpO2 99 % 06/11/2018  1:50 PM  Vitals shown include unvalidated device data.  Last Pain:  Vitals:   06/11/18 1350  TempSrc: Temporal  PainSc: 0-No pain         Complications: No apparent anesthesia complications

## 2018-06-11 NOTE — Anesthesia Postprocedure Evaluation (Signed)
Anesthesia Post Note  Patient: Alyssa Baird  Procedure(s) Performed: SHOULDER ARTHROSCOPY WITH ROTATOR CUFF REPAIR (Right Shoulder)  Patient location during evaluation: PACU Anesthesia Type: General Level of consciousness: awake and alert Pain management: pain level controlled Vital Signs Assessment: post-procedure vital signs reviewed and stable Respiratory status: spontaneous breathing, nonlabored ventilation, respiratory function stable and patient connected to nasal cannula oxygen Cardiovascular status: blood pressure returned to baseline and stable Postop Assessment: no apparent nausea or vomiting Anesthetic complications: no     Last Vitals:  Vitals:   06/11/18 1419 06/11/18 1436  BP: (!) 144/72 (!) 153/71  Pulse: 73 70  Resp: 15 13  Temp:    SpO2: 97% 96%    Last Pain:  Vitals:   06/11/18 1436  TempSrc:   PainSc: 0-No pain                 Cleda MccreedyJoseph K Jacalyn Biggs

## 2018-06-11 NOTE — Anesthesia Post-op Follow-up Note (Signed)
Anesthesia QCDR form completed.        

## 2018-06-11 NOTE — Op Note (Signed)
06/11/2018  1:26 PM  PATIENT:  Alyssa Baird  72 y.o. female  PRE-OPERATIVE DIAGNOSIS:  TEAR OF RIGHT ROTATOR CUFF  POST-OPERATIVE DIAGNOSIS:  TEAR OF RIGHT ROTATOR CUF  PROCEDURE:  Procedure(s): SHOULDER ARTHROSCOPY WITH ROTATOR CUFF REPAIR (Right), distal clavicle excision, subacromial decompression and limited intra-articular debridement with biceps tenotomy  SURGEON:  Surgeon(s) and Role:    * Lovell Sheehan, MD - Primary  ASSIST: Carlynn Spry, PA-C  ANESTHESIA:   regional and general   PREOPERATIVE INDICATIONS:  Alyssa Baird is a  72 y.o. female with a diagnosis of TEAR OF RIGHT ROTATOR CUFF who failed conservative measures and elected for surgical management.    The risks benefits and alternatives were discussed with the patient preoperatively including but not limited to the risks of infection, bleeding, nerve injury, persistent pain or weakness, failure of the hardware, re-tear of the rotator cuff and the need for further surgery. Medical risks include DVT and pulmonary embolism, myocardial infarction, stroke, pneumonia, respiratory failure and death. Patient understood these risks and wished to proceed.  OPERATIVE IMPLANTS: Arthrex SpeedBridge System  OPERATIVE PROCEDURE: The patient was met in the preoperative area. The right shoulder was signed with my initials according the hospital's correct site of surgery protocol. The patient is brought to the OR and underwent a supraclavicular block and general endotracheal intubation by the anesthesia service.  The patient was placed in a beachchair position. A spider arm positioner was used for this case. Examination under anesthesia revealed negative sulcus sign and no anterior or posterior instability.  The patient was prepped and draped in a sterile fashion. A timeout was performed to verify the patient's name, date of birth, medical record number, correct site of surgery and correct procedure to be performed there was  also used to verify the patient received antibiotics that all appropriate instruments, implants and radiographs studies were available in the room. Once all in attendance were in agreement case began.  Bony landmarks were drawn out with a surgical marker along with proposed arthroscopy incisions. These were pre-injected with 1% lidocaine plain. An 11 blade was used to establish a posterior portal through which the arthroscope was placed in the glenohumeral joint. A full diagnostic examination of the shoulder was performed. The anterior portal was established under direct visualization with an 18-gauge spinal needle.  A 5.75 mm arthroscopic cannula was placed through the anterior portal.   The intra-articular portion of the biceps tendon was found to have a partial tear involving greater than 50% of the diameter. Therefore the decision was made to perform a tenotomy. An arthroscopic shaver was used to release the biceps tendon off the superior labrum. The arthroscopic shaver was then used to debride the frayed edges of the labrum. There were no anterior or superior labral tears seen.  Subscapularis tendon had partial tearing of the superior portion.  This was debrided with the shaver. Patient had a full-thickness tear involving the supraspinatus with retraction beyond the glenoid. This was not repairable. The infraspinatus was torn with 1 cm of retraction. There were no loose bodies within the inferior recess and no evidence of HAGL lesion.  The arthroscope was then placed in the subacromial space. A lateral portal was then established using an 18-gauge spinal needle for localization.   The greater tuberosity was debrided using a 5.5 mm resector shaver blade to remove all remaining foreign fibers of the rotator cuff.  Debridement was performed until punctate bleeding was seen at the greater tuberosity footprint,  which will allow for rotator cuff healing.  Extensive bursitis was encountered and debrided using a  4-0 resector shaver blade and a 90 ArthroCare wand from the lateral portal. The posterior cuff was mobilized and the tape passed through the rotator cuff. The tape was then crossed in usual fashion and fixated on the lateral side with one SwiveLock anchor. The final construct was stable and moved as a unit with improved coverage of the humeral head.  Using the arthroscopic burr, the distal clavicle and subacromial spurs were resected. An accessory portal was made over the [facility] joint using a windshield-washer technique.  All incisions were copiously irrigated. Skin closure for the arthroscopic incisions was performed with 4-0 nylon.  A dry sterile dressing including Steri-Strips was applied . The patient was placed in an abduction sling.  All sharp and instrument counts were correct at the conclusion of the case. I was scrubbed and present for the entire case. I spoke with the patient's family in the post-op consultation room and informed them that the case had been performed without complication and the patient was stable in recovery room.   Kurtis Bushman, MD

## 2018-06-11 NOTE — Care Management (Signed)
Decreased breath sounds R lower lung. Saturations have gradually come up, and now is consistently in the 93-94 range. 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 I think she will do ok at home. She has good family support.

## 2018-06-11 NOTE — Anesthesia Procedure Notes (Signed)
Procedure Name: Intubation Date/Time: 06/11/2018 11:19 AM Performed by: Rosaria FerriesPiscitello, Joseph K, MD Pre-anesthesia Checklist: Patient identified, Emergency Drugs available, Suction available, Patient being monitored and Timeout performed Patient Re-evaluated:Patient Re-evaluated prior to induction Oxygen Delivery Method: Circle system utilized Preoxygenation: Pre-oxygenation with 100% oxygen Induction Type: IV induction Ventilation: Mask ventilation without difficulty and Oral airway inserted - appropriate to patient size Laryngoscope Size: McGraph and 3 Grade View: Grade I Tube type: Oral Tube size: 7.0 mm Number of attempts: 1 Airway Equipment and Method: Stylet Placement Confirmation: ETT inserted through vocal cords under direct vision,  positive ETCO2 and breath sounds checked- equal and bilateral Secured at: 21 cm Tube secured with: Tape Dental Injury: Teeth and Oropharynx as per pre-operative assessment  Comments: Electively used Mcgrath 2/2 poor dental condition. Dental condition same as pre op post intubation

## 2018-06-11 NOTE — H&P (Signed)
The patient has been re-examined, and the chart reviewed, and there have been no interval changes to the documented history and physical.  Plan a right shoulder arthroscopic rotator cuff repair, distal clavicle excision, decompression and biceps tenotomy today.  Anesthesia is consulted regarding a peripheral nerve block for post-operative pain.  The risks, benefits, and alternatives have been discussed at length, and the patient is willing to proceed.     

## 2018-06-11 NOTE — Progress Notes (Signed)
Dr. Maisie Fushomas at bedside talking with patient. Okay to send patient home, O2 sats have steadily stayed between 92-95. Encouraged incentive spirometer every hour even through the night.

## 2018-06-11 NOTE — Anesthesia Preprocedure Evaluation (Signed)
Anesthesia Evaluation  Patient identified by MRN, date of birth, ID band Patient awake    Reviewed: Allergy & Precautions, H&P , NPO status , Patient's Chart, lab work & pertinent test results  History of Anesthesia Complications Negative for: history of anesthetic complications  Airway Mallampati: III  TM Distance: >3 FB Neck ROM: limited    Dental  (+) Chipped, Poor Dentition, Missing, Loose   Pulmonary neg shortness of breath, asthma ,           Cardiovascular Exercise Tolerance: Good hypertension, (-) angina(-) Past MI and (-) DOE      Neuro/Psych negative neurological ROS  negative psych ROS   GI/Hepatic negative GI ROS, Neg liver ROS, neg GERD  ,  Endo/Other  negative endocrine ROS  Renal/GU      Musculoskeletal  (+) Arthritis ,   Abdominal   Peds  Hematology negative hematology ROS (+)   Anesthesia Other Findings Past Medical History: No date: Arthritis No date: Asthma     Comment:  only URI No date: Hyperlipidemia No date: Hypertension  Past Surgical History: No date: ABDOMINAL HYSTERECTOMY No date: CHOLECYSTECTOMY  BMI    Body Mass Index:  28.02 kg/m      Reproductive/Obstetrics negative OB ROS                             Anesthesia Physical Anesthesia Plan  ASA: III  Anesthesia Plan: General ETT   Post-op Pain Management: GA combined w/ Regional for post-op pain   Induction: Intravenous  PONV Risk Score and Plan: Ondansetron, Dexamethasone, Midazolam and Treatment may vary due to age or medical condition  Airway Management Planned: Oral ETT  Additional Equipment:   Intra-op Plan:   Post-operative Plan: Extubation in OR  Informed Consent: I have reviewed the patients History and Physical, chart, labs and discussed the procedure including the risks, benefits and alternatives for the proposed anesthesia with the patient or authorized representative who  has indicated his/her understanding and acceptance.   Dental Advisory Given  Plan Discussed with: Anesthesiologist, CRNA and Surgeon  Anesthesia Plan Comments: (Patient consented for risks of anesthesia including but not limited to:  - adverse reactions to medications - damage to teeth, lips or other oral mucosa - sore throat or hoarseness - Damage to heart, brain, lungs or loss of life  Patient voiced understanding.)        Anesthesia Quick Evaluation

## 2018-06-11 NOTE — Discharge Instructions (Signed)
AMBULATORY SURGERY  DISCHARGE INSTRUCTIONS   1) The drugs that you were given will stay in your system until tomorrow so for the next 24 hours you should not:  A) Drive an automobile B) Make any legal decisions C) Drink any alcoholic beverage   2) You may resume regular meals tomorrow.  Today it is better to start with liquids and gradually work up to solid foods.  You may eat anything you prefer, but it is better to start with liquids, then soup and crackers, and gradually work up to solid foods.   3) Please notify your doctor immediately if you have any unusual bleeding, trouble breathing, redness and pain at the surgery site, drainage, fever, or pain not relieved by medication.    4) Additional Instructions:         Please contact your physician with any problems or Same Day Surgery at [phone], Monday through Friday 6 am to 4 pm, or [facility] Main number at [phone].Wear sling at all times, including sleep.  You will need to use the sling for a total of 4 weeks following surgery.  Do not try and lift your arm up or away from your body for any reason.   Keep the dressing dry.  You may remove bandage in 3 days.  You may place Band-Aids over top of the incisions.  May shower once dressing is removed in 3 days.  Remove sling carefully only for showers, leaving arm down by your side while in the shower.  +++ Make sure to take some pain medication this evening before you fall asleep, in preparation for the nerve block wearing off in the middle of the night.  If the the pain medication causes itching, or is too strong, try taking a single tablet at a time, or combining with Benadryl.  You may be most comfortable sleeping in a recliner.  If you do sleep in near bed, placed pillows behind the shoulder that have the operation to support it.       Interscalene Nerve Block with Exparel  1.  For your surgery you have received an Interscalene Nerve Block with  Exparel. 2. Nerve Blocks affect many types of nerves, including nerves that control movement, pain and normal sensation.  You may experience feelings such as numbness, tingling, heaviness, weakness or the inability to move your arm or the feeling or sensation that your arm has "fallen asleep". 3. A nerve block with Exparel can last up to 5 days.  Usually the weakness wears off first.  The tingling and heaviness usually wear off next.  Finally you may start to notice pain.  Keep in mind that this may occur in any order.  Once a nerve block starts to wear off it is usually completely gone within 60 minutes. 4. ISNB may cause mild shortness of breath, a hoarse voice, blurry vision, unequal pupils, or drooping of the face on the same side as the nerve block.  These symptoms will usually resolve with the numbness.  Very rarely the procedure itself can cause mild seizures. 5. If needed, your surgeon will give you a prescription for pain medication.  It will take about 60 minutes for the oral pain medication to become fully effective.  So, it is recommended that you start taking this medication before the nerve block first begins to wear off, or when you first begin to feel discomfort. 6. Take your pain medication only as prescribed.  Pain medication can cause sedation and   decrease your breathing if you take more than you need for the level of pain that you have. 7. Nausea is a common side effect of many pain medications.  You may want to eat something before taking your pain medicine to prevent nausea. 8. After an Interscalene nerve block, you cannot feel pain, pressure or extremes in temperature in the effected arm.  Because your arm is numb it is at an increased risk for injury.  To decrease the possibility of injury, please practice the following:  a. While you are awake change the position of your arm frequently to prevent too much pressure on any one area for prolonged periods of time. b.  If you have a  cast or tight dressing, check the color or your fingers every couple of hours.  Call your surgeon with the appearance of any discoloration (white or blue). c. If you are given a sling to wear before you go home, please wear it  at all times until the block has completely worn off.  Do not get up at night without your sling. d. Please contact [facility]est[facility] or your surgeon if you do not begin to regain sensation after 7 days from the surgery.  Anesthesia may be contacted by calling the Same Day Surgery Department, Mon. through Fri., 6 am to 4 pm at [phone].   e. If you experience any other problems or concerns, please contact your surgeon's office. f. If you experience severe or prolonged shortness of breath go to the nearest emergency department. 

## 2018-06-11 NOTE — Progress Notes (Signed)
Spoke to Dr. Providence LaniusHowell about patients O2 sats of 90-92 on 1L [place]. Stated if we could get patient to 5692 or above on room air with coaching to take deep breaths, patient is okay to go home.

## 2018-06-12 ENCOUNTER — Encounter: Payer: Self-pay | Admitting: Orthopedic Surgery

## 2018-06-13 NOTE — Anesthesia Postprocedure Evaluation (Signed)
Anesthesia Post Note  Patient: Modena SlaterSallie L Gomes  Procedure(s) Performed: SHOULDER ARTHROSCOPY WITH ROTATOR CUFF REPAIR (Right Shoulder)  Patient location during evaluation: PACU Anesthesia Type: General Level of consciousness: awake and alert Pain management: pain level controlled Vital Signs Assessment: post-procedure vital signs reviewed and stable Respiratory status: spontaneous breathing, nonlabored ventilation, respiratory function stable and patient connected to nasal cannula oxygen Cardiovascular status: blood pressure returned to baseline and stable Postop Assessment: no apparent nausea or vomiting Anesthetic complications: no     Last Vitals:  Vitals:   06/11/18 1648 06/11/18 1706  BP: (!) 177/67 131/82  Pulse: (!) 55 72  Resp: 16 16  Temp:  (!) 36.1 C  SpO2: 93% 94%    Last Pain:  Vitals:   06/12/18 0844  TempSrc:   PainSc: 0-No pain                 Cleda MccreedyJoseph K Dung Salinger

## 2018-06-25 ENCOUNTER — Ambulatory Visit: Payer: PRIVATE HEALTH INSURANCE | Attending: Orthopedic Surgery

## 2018-06-25 DIAGNOSIS — M25511 Pain in right shoulder: Secondary | ICD-10-CM | POA: Diagnosis not present

## 2018-06-25 DIAGNOSIS — M6281 Muscle weakness (generalized): Secondary | ICD-10-CM | POA: Diagnosis not present

## 2018-06-25 NOTE — Patient Instructions (Signed)
Pt was recommended to continue performing bent over pendulums clockwise and counterclockwise but with emphasis on using her body to move her arm for passive movement at her R shoulder joint. Pt verbalized understanding.

## 2018-06-25 NOTE — Therapy (Signed)
[facility] SPOR[facility] MEDICINE [address] [place], [address] Phone: [phone]   Fax:  [phone]  Physical Therapy Evaluation  Patient Details  Name: Alyssa Baird MRN: [id number] Date of Birth: 1946/05/04 Referring Provider: Cassell Smiles, MD   Encounter Date: 06/25/2018  PT End of Session - 06/25/18 1653    Visit Number  1    Number of Visits  13    Date for PT Re-Evaluation  08/08/18    Authorization Type  1     Authorization Time Period  of 12 worker's comp    PT Start Time  1653    PT Stop Time  1749    PT Time Calculation (min)  56 min    Activity Tolerance  Patient tolerated treatment well    Behavior During Therapy  [facility] for tasks assessed/performed       Past Medical History:  Diagnosis Date  . Arthritis   . Asthma    only URI  . Hyperlipidemia   . Hypertension     Past Surgical History:  Procedure Laterality Date  . ABDOMINAL HYSTERECTOMY    . CHOLECYSTECTOMY    . SHOULDER ARTHROSCOPY WITH ROTATOR CUFF REPAIR Right 06/11/2018   Procedure: SHOULDER ARTHROSCOPY WITH ROTATOR CUFF REPAIR;  Surgeon: Lyndle Herrlich, MD;  Location: [facility] ORS;  Service: Orthopedics;  Laterality: Right;    There were no vitals filed for this visit.   Subjective Assessment - 06/25/18 1656    Subjective  R shoulder: 7/10 currently (pt sitting, wearing a sling)     Pertinent History  S/P R rotator cuff surgery on 06/11/2018. Pt was lifting a bag of linnen at work last year (07/01/2017) which resulted in injury. Thought her shoulder would get better on its own but it got worse.  Had PT at [facility] which did not fix it.   Has not had any PT since her surgery. Was told to perform pendulums at home.  Forgot to use her abduction pillow for her sling.  Also feels pain at the top of her R shoulder blade.  Pt is R hand dominant.  No falls within the last 6 months.  No fear of falling.     Patient Stated Goals  Wants to be better able  to use her R arm to take a shower, and don and doff clothes     Currently in Pain?  Yes    Pain Score  7     Pain Location  Shoulder    Pain Orientation  Right    Pain Type  Surgical pain;Chronic pain    Pain Onset  More than a month ago    Pain Frequency  Constant    Aggravating Factors   movements involving moving her R arm    Pain Relieving Factors  rest         [facility] PT Assessment - 06/25/18 1707      Assessment   Medical Diagnosis  Unspecified rotator cuff tear or rupture of R shoulder, not specified as traumatic, bicipital tendinitis, R shoulder, other specified arthritis R shoulder    Referring Provider  Cassell Smiles, MD    Onset Date/Surgical Date  06/11/18    Hand Dominance  Right    Next MD Visit  07/18/2018    Prior Therapy  Pt participated in PT prior to surgery. No PT since surgery.       Precautions   Precaution Comments  Follow protocol  Restrictions   Other Position/Activity Restrictions  Follow protocol      Balance Screen   Has the patient fallen in the past 6 months  No    Has the patient had a decrease in activity level because of a fear of falling?   No    Is the patient reluctant to leave their home because of a fear of falling?   No      Prior Function   Vocation  Full time employment    Vocation Requirements  PLOF: better able to raise her R arm, reach, use her R arm for self care as well as less difficulty donning and doffing clothes.       Observation/Other Assessments   Observations  4 surgical incisions R shoulder with steri strips. Healing well.     Focus on Therapeutic Outcomes (FOTO)   shoulder FOTO: 4      Posture/Postural Control   Posture Comments  R shoulder in sling. Protracted neck, R shoulder shrug and slight protraction      AROM   Left Shoulder Flexion  125 Degrees    Left Shoulder ABduction  123 Degrees scaption      PROM   Right Shoulder Flexion  75 Degrees degrees in bent bent over pendulum position    Right Shoulder  ABduction  64 Degrees supine    Right Shoulder Internal Rotation  72 Degrees in scapular plan    Right Shoulder External Rotation  35 Degrees in scapular plan      Strength   Overall Strength Comments  R shoulder strength not assessed to allow for healing      Palpation   Palpation comment  R upper trap tension. TTP R anterior lateral shoulder.                 Objective measurements completed on examination: See above findings.       Pt states that her blood pressure is controlled.   Per phone conversation with Altamese CabalMaurice Jones PA-C on 06/24/2018: Can use [facility] rotator cuff protocol, no overhead motions, no overhead lifting and just do PROM for the first 6 weeks  Therapeutic exercise   Bent over pendulums 30 seconds clocwise and 30 seconds counterclockwise with emphasis on PROM for 2 sets. Cues to not to use her R shoulder muscles  Supine PROM with PT   Flexion 10x3 (not past 90 degrees yet)   scaption 10x3 (not past 90 degrees yet)    ER and IR in scapular plane 10x2 each   Improved ability to keep muscles relaxed with PROM after min to mod verbal, visual, tactile cues.     Patient is a 72 year old female who came to physical therapy S/P R rotator cuff repair, distal clavicle excision, subacromial decompression and limited intra-articular debridement with biceps tenotomy on 06/11/2018. She also presents with limited R shoulder PROM, weakness, TTP, increased R upper trap muscle tension, and difficulty performing functional tasks such as using her R arm to shower, as well as donning and doffing clothes. Patient will benefit from skilled physical therapy services to address the aforementioned deficits.            PT Education - 06/25/18 1816    Education Details  ther-ex, HEP, plan of care    Person(s) Educated  Patient    Methods  Explanation;Demonstration;Tactile cues;Verbal cues    Comprehension  Returned demonstration;Verbalized understanding        PT Short Term Goals - 06/25/18 1825  PT SHORT TERM GOAL #1   Title  Patient will be independent with her HEP to promote ability to raise her R arm against gravity and use it for functional tasks when appropriate.     Time  3    Period  Weeks    Status  New    Target Date  07/18/18        PT Long Term Goals - 06/25/18 1826      PT LONG TERM GOAL #1   Title  Patient will improve R shoulder flexion PROM to at least 90 degrees flexion and scaption/abduction to at least 90 degrees to promote ability to raise her R arm, use her R UE to perform functional tasks when appropriate.     Baseline  R shoulder PROM: 75 degrees flexion, 64 degrees abduction (06/25/2018)    Time  6    Period  Weeks    Status  New    Target Date  08/08/18      PT LONG TERM GOAL #2   Title  Patient will improve R shoulder ER PROM to at least 70 degrees and IR PROM to at least 90 degrees to promote ability to use her R UE for self care as well as promote ability to don and doff clothes when appropriate.     Baseline  R shoulder PROM in scapular plane: 35 degrees ER, 72 degrees IR (06/25/2018)    Time  6    Period  Weeks    Status  New    Target Date  08/08/18      PT LONG TERM GOAL #3   Title  Pt will improve her R shoulder FOTO score by at least 20 points as a demonstration of improved function.     Baseline  R shoulder FOTO 4 (06/25/2018)    Time  6    Period  Weeks    Status  New    Target Date  08/08/18             Plan - 06/25/18 1816    Clinical Impression Statement  Patient is a 72 year old female who came to physical therapy S/P R rotator cuff repair, distal clavicle excision, subacromial decompression and limited intra-articular debridement with biceps tenotomy on 06/11/2018. She also presents with limited R shoulder PROM, weakness, TTP, increased R upper trap muscle tension, and difficulty performing functional tasks such as using her R arm to shower, as well as donning and doffing  clothes. Patient will benefit from skilled physical therapy services to address the aforementioned deficits.      History and Personal Factors relevant to plan of care:  Chronicity of condition prior to surgery, pain, weakness, TTP, difficulty reaching, using her R UE for self care, donning and doffing clothes.     Clinical Presentation  Stable    Clinical Presentation due to:  Pt clinical presentation appropriate for 2 weeks post op.     Clinical Decision Making  Low    Rehab Potential  Fair    Clinical Impairments Affecting Rehab Potential  (-) age, chronicity of condition prior to surgery, healing time; (+) motivated    PT Frequency  2x / week    PT Duration  6 weeks    PT Treatment/Interventions  Manual techniques;Neuromuscular re-education;Therapeutic activities;Therapeutic exercise;Patient/family education;Passive range of motion;Dry needling;Aquatic Therapy;Electrical Stimulation;Iontophoresis 4mg /ml Dexamethasone;Ultrasound    PT Next Visit Plan  PROM R shoulder, no overhead motions, no overhead lifting for first 6 weeks. Can follow  [facility] rotator cuff protocol per Altamese Cabal PA-C    Consulted and Agree with Plan of Care  Patient       Patient will benefit from skilled therapeutic intervention in order to improve the following deficits and impairments:  Pain, Postural dysfunction, Improper body mechanics, Impaired UE functional use, Decreased strength, Decreased range of motion  Visit Diagnosis: Right shoulder pain, unspecified chronicity - Plan: PT plan of care cert/re-cert  Muscle weakness (generalized) - Plan: PT plan of care cert/re-cert     Problem List There are no active problems to display for this patient.   Loralyn Freshwater PT, DPT   06/25/2018, 6:46 PM  [facility] PHYSICAL AND SPORTS MEDICINE [address], [place], [address] Phone: [phone]   Fax:  [phone]  Name: Alyssa Baird MRN:  [id number] Date of Birth: [birth year]

## 2018-06-26 ENCOUNTER — Ambulatory Visit: Payer: Self-pay | Admitting: Cardiovascular Disease

## 2018-06-27 ENCOUNTER — Ambulatory Visit: Payer: PRIVATE HEALTH INSURANCE

## 2018-06-27 DIAGNOSIS — M6281 Muscle weakness (generalized): Secondary | ICD-10-CM | POA: Diagnosis not present

## 2018-06-27 DIAGNOSIS — M25511 Pain in right shoulder: Secondary | ICD-10-CM

## 2018-06-27 NOTE — Therapy (Signed)
[facility] PHYSICAL AND SPORTS MEDICINE [address] [place], [address] Phone: [phone]   Fax:  [phone]  Physical Therapy Treatment  Patient Details  Name: Alyssa Baird MRN: [id number] Date of Birth: [birth year] Referring Provider: Cassell Smiles, MD   Encounter Date: 06/27/2018  PT End of Session - 06/27/18 1520    Visit Number  2    Number of Visits  13    Date for PT Re-Evaluation  08/08/18    Authorization Type  2    Authorization Time Period  of 12 worker's comp    PT Start Time  1520    PT Stop Time  1549    PT Time Calculation (min)  29 min    Activity Tolerance  Patient tolerated treatment well    Behavior During Therapy  [facility] for tasks assessed/performed       Past Medical History:  Diagnosis Date  . Arthritis   . Asthma    only URI  . Hyperlipidemia   . Hypertension     Past Surgical History:  Procedure Laterality Date  . ABDOMINAL HYSTERECTOMY    . CHOLECYSTECTOMY    . SHOULDER ARTHROSCOPY WITH ROTATOR CUFF REPAIR Right 06/11/2018   Procedure: SHOULDER ARTHROSCOPY WITH ROTATOR CUFF REPAIR;  Surgeon: Lyndle Herrlich, MD;  Location: [facility] ORS;  Service: Orthopedics;  Laterality: Right;    There were no vitals filed for this visit.  Subjective Assessment - 06/27/18 1521    Subjective  R shoulder feels good. No pain currently.     Pertinent History  S/P R rotator cuff surgery on 06/11/2018. Pt was lifting a bag of linnen at work last year (07/01/2017) which resulted in injury. Thought her shoulder would get better on its own but it got worse.  Had PT at [facility] which did not fix it.   Has not had any PT since her surgery. Was told to perform pendulums at home.  Forgot to use her abduction pillow for her sling.  Also feels pain at the top of her R shoulder blade.  Pt is R hand dominant.  No falls within the last 6 months.  No fear of falling.     Patient Stated Goals  Wants to be better able to use her R arm  to take a shower, and don and doff clothes     Currently in Pain?  No/denies    Pain Score  0-No pain    Pain Onset  More than a month ago                               PT Education - 06/27/18 1554    Education Details  ther-ex    Person(s) Educated  Patient    Methods  Explanation;Demonstration;Tactile cues;Verbal cues    Comprehension  Returned demonstration;Verbalized understanding      Objective   2 weeks post op   Per phone conversation with Altamese Cabal PA-C on 06/24/2018: Can use [facility] rotator cuff protocol, no overhead motions, no overhead lifting and just do PROM for the first 6 weeks    Therapeutic exercise   Performed in pain free range  Supine PROM with PT              Flexion 10x3 (not past 90 degrees yet)              scaption 10x3 (not past 90 degrees  yet)               ER and IR in scapular plane 10x3 each direction   R elbow extension 10x3  Improved ability to keep muscles relaxed with PROM after min to mod verbal, visual, tactile cues.    Able to achieve 90 degrees supine R shoulder flexion and scaption PROM today. Continued working on PROM to promote range and decrease/prevent stiffness. Pt tolerated session well without aggravation of symptoms.      PT Short Term Goals - 06/25/18 1825      PT SHORT TERM GOAL #1   Title  Patient will be independent with her HEP to promote ability to raise her R arm against gravity and use it for functional tasks when appropriate.     Time  3    Period  Weeks    Status  New    Target Date  07/18/18        PT Long Term Goals - 06/25/18 1826      PT LONG TERM GOAL #1   Title  Patient will improve R shoulder flexion PROM to at least 90 degrees flexion and scaption/abduction to at least 90 degrees to promote ability to raise her R arm, use her R UE to perform functional tasks when appropriate.     Baseline  R shoulder PROM: 75 degrees flexion, 64 degrees  abduction (06/25/2018)    Time  6    Period  Weeks    Status  New    Target Date  08/08/18      PT LONG TERM GOAL #2   Title  Patient will improve R shoulder ER PROM to at least 70 degrees and IR PROM to at least 90 degrees to promote ability to use her R UE for self care as well as promote ability to don and doff clothes when appropriate.     Baseline  R shoulder PROM in scapular plane: 35 degrees ER, 72 degrees IR (06/25/2018)    Time  6    Period  Weeks    Status  New    Target Date  08/08/18      PT LONG TERM GOAL #3   Title  Pt will improve her R shoulder FOTO score by at least 20 points as a demonstration of improved function.     Baseline  R shoulder FOTO 4 (06/25/2018)    Time  6    Period  Weeks    Status  New    Target Date  08/08/18            Plan - 06/27/18 1555    Clinical Impression Statement  Able to achieve 90 degrees supine R shoulder flexion and scaption PROM today. Continued working on PROM to promote range and decrease/prevent stiffness. Pt tolerated session well without aggravation of symptoms.     Rehab Potential  Fair    Clinical Impairments Affecting Rehab Potential  (-) age, chronicity of condition prior to surgery, healing time; (+) motivated    PT Frequency  2x / week    PT Duration  6 weeks    PT Treatment/Interventions  Manual techniques;Neuromuscular re-education;Therapeutic activities;Therapeutic exercise;Patient/family education;Passive range of motion;Dry needling;Aquatic Therapy;Electrical Stimulation;Iontophoresis 4mg /ml Dexamethasone;Ultrasound    PT Next Visit Plan  PROM R shoulder, no overhead motions, no overhead lifting for first 6 weeks. Can follow [facility] rotator cuff protocol per Altamese CabalMaurice Jones PA-C    Consulted and Agree with Plan of Care  Patient  Patient will benefit from skilled therapeutic intervention in order to improve the following deficits and impairments:  Pain, Postural dysfunction, Improper body  mechanics, Impaired UE functional use, Decreased strength, Decreased range of motion  Visit Diagnosis: Right shoulder pain, unspecified chronicity  Muscle weakness (generalized)     Problem List There are no active problems to display for this patient.   Loralyn Freshwater PT, DPT   06/27/2018, 3:56 PM  [facility] PHYSICAL AND SPORTS MEDICINE [address] [place], [address] Phone: [phone]   Fax:  [phone]  Name: Alyssa Baird MRN: [id number] Date of Birth: 05-18-1946

## 2018-07-01 ENCOUNTER — Ambulatory Visit: Payer: PRIVATE HEALTH INSURANCE

## 2018-07-04 ENCOUNTER — Ambulatory Visit: Payer: PRIVATE HEALTH INSURANCE | Attending: Orthopedic Surgery

## 2018-07-04 DIAGNOSIS — M6281 Muscle weakness (generalized): Secondary | ICD-10-CM | POA: Diagnosis present

## 2018-07-04 DIAGNOSIS — M25511 Pain in right shoulder: Secondary | ICD-10-CM | POA: Diagnosis present

## 2018-07-04 NOTE — Therapy (Signed)
[facility] PHYSICAL AND SPORTS MEDICINE [address] [place], [place], [address] Phone: [phone]   Fax:  [phone]  Physical Therapy Treatment  Patient Details  Name: Alyssa SlaterSallie L Baird MRN: [id number] Date of Birth: 10-16-1946 Referring Provider: Cassell SmilesJames Bowers, MD   Encounter Date: 07/04/2018  PT End of Session - 07/04/18 1107    Visit Number  3    Number of Visits  13    Date for PT Re-Evaluation  08/08/18    Authorization Type  3    Authorization Time Period  of 12 worker's comp    PT Start Time  1107    PT Stop Time  1137    PT Time Calculation (min)  30 min    Activity Tolerance  Patient tolerated treatment well    Behavior During Therapy  [facility]       Past Medical History:  Diagnosis Date  . Arthritis   . Asthma    only URI  . Hyperlipidemia   . Hypertension     Past Surgical History:  Procedure Laterality Date  . ABDOMINAL HYSTERECTOMY    . CHOLECYSTECTOMY    . SHOULDER ARTHROSCOPY WITH ROTATOR CUFF REPAIR Right 06/11/2018   Procedure: SHOULDER ARTHROSCOPY WITH ROTATOR CUFF REPAIR;  Surgeon: Lyndle HerrlichBowers, James R, MD;  Location: [facility] OR[facility];  Service: Orthopedics;  Laterality: Right;    There were no vitals filed for this visit.  Subjective Assessment - 07/04/18 1113    Subjective  R shoulder was bothering her Monday and at night. Just a little pain today, not a whole lot.  7/10 R shoulder currently.     Pertinent History  S/P R rotator cuff surgery on 06/11/2018. Pt was lifting a bag of linnen at work last year (07/01/2017) which resulted in injury. Thought her shoulder would get better on its own but it got worse.  Had PT at [facility] which did not fix it.   Has not had any PT since her surgery. Was told to perform pendulums at home.  Forgot to use her abduction pillow for her sling.  Also feels pain at the top of her R shoulder blade.  Pt is R hand dominant.  No falls within the last 6 months.  No fear of  falling.     Patient Stated Goals  Wants to be better able to use her R arm to take a shower, and don and doff clothes     Currently in Pain?  Yes    Pain Score  7     Pain Onset  More than a month ago                               PT Education - 07/04/18 1153    Education Details  ther-ex/relax for PROM    Person(s) Educated  Patient    Methods  Explanation;Demonstration;Tactile cues;Verbal cues    Comprehension  Verbalized understanding;Returned demonstration       Objective   3 weeks post op   Per phone conversation with Altamese CabalMaurice Jones PA-C on 06/24/2018: Can use [facility] rotator cuff protocol, no overhead motions, no overhead lifting and just do PROM for the first 6 weeks    Therapeutic exercise   Fixed pt sling for proper position of abductor pillow and sling. Pt states shoulder feeling better afterwards.   Performed in pain free range  Supine PROM with PT  Flexion 10x3 (not past 90 degrees yet)  scaption 10x3 (not past 90 degrees yet)   ER and IR in scapular plane 10x3 each direction             R elbow extension 10x3  Improvedability to keep muscles relaxed with PROMafter min tomod verbal, visual, tactile cues.  Continued working on PROM of R shoulder all planes per MD guidelines. Able to achieve at least 90 degrees flexion and scaption PROM. Decreased pain to 2-3/10 after session. Pt will benefit from continued skilled physical therapy services to promote ROM, decrease stiffness, and improve comfort level for R UE.      PT Short Term Goals - 06/25/18 1825      PT SHORT TERM GOAL #1   Title  Patient will be independent with her HEP to promote ability to raise her R arm against gravity and use it for functional tasks when appropriate.     Time  3    Period  Weeks    Status  New    Target Date  07/18/18        PT Long Term Goals - 06/25/18 1826      PT LONG TERM  GOAL #1   Title  Patient will improve R shoulder flexion PROM to at least 90 degrees flexion and scaption/abduction to at least 90 degrees to promote ability to raise her R arm, use her R UE to perform functional tasks when appropriate.     Baseline  R shoulder PROM: 75 degrees flexion, 64 degrees abduction (06/25/2018)    Time  6    Period  Weeks    Status  New    Target Date  08/08/18      PT LONG TERM GOAL #2   Title  Patient will improve R shoulder ER PROM to at least 70 degrees and IR PROM to at least 90 degrees to promote ability to use her R UE for self care as well as promote ability to don and doff clothes when appropriate.     Baseline  R shoulder PROM in scapular plane: 35 degrees ER, 72 degrees IR (06/25/2018)    Time  6    Period  Weeks    Status  New    Target Date  08/08/18      PT LONG TERM GOAL #3   Title  Pt will improve her R shoulder FOTO score by at least 20 points as a demonstration of improved function.     Baseline  R shoulder FOTO 4 (06/25/2018)    Time  6    Period  Weeks    Status  New    Target Date  08/08/18            Plan - 07/04/18 1103    Clinical Impression Statement  Continued working on PROM of R shoulder all planes per MD guidelines. Able to achieve at least 90 degrees flexion and scaption PROM. Decreased pain to 2-3/10 after session. Pt will benefit from continued skilled physical therapy services to promote ROM, decrease stiffness, and improve comfort level for R UE.    Rehab Potential  Fair    Clinical Impairments Affecting Rehab Potential  (-) age, chronicity of condition prior to surgery, healing time; (+) motivated    PT Frequency  2x / week    PT Duration  6 weeks    PT Treatment/Interventions  Manual techniques;Neuromuscular re-education;Therapeutic activities;Therapeutic exercise;Patient/family education;Passive range of motion;Dry needling;Aquatic Therapy;Electrical Stimulation;Iontophoresis 4mg /ml Dexamethasone;Ultrasound  PT Next  Visit Plan  PROM R shoulder, no overhead motions, no overhead lifting for first 6 weeks. Can follow [facility] rotator cuff protocol per Altamese Cabal PA-C    Consulted and Agree with Plan of Care  Patient       Patient will benefit from skilled therapeutic intervention in order to improve the following deficits and impairments:  Pain, Postural dysfunction, Improper body mechanics, Impaired UE functional use, Decreased strength, Decreased range of motion  Visit Diagnosis: Right shoulder pain, unspecified chronicity  Muscle weakness (generalized)     Problem List There are no active problems to display for this patient.  Loralyn Freshwater PT, DPT   07/04/2018, 11:56 AM  [facility] PHYSICAL AND SPORTS MEDICINE [address], [place], [address] Phone: [phone]   Fax:  [phone]  Name: Alyssa Baird MRN: [id number] Date of Birth: 03-30-1946

## 2018-07-09 ENCOUNTER — Ambulatory Visit: Payer: PRIVATE HEALTH INSURANCE

## 2018-07-11 ENCOUNTER — Ambulatory Visit: Payer: PRIVATE HEALTH INSURANCE

## 2018-07-11 ENCOUNTER — Ambulatory Visit
Admission: RE | Admit: 2018-07-11 | Discharge: 2018-07-11 | Disposition: A | Discharge status: Home / Self Care | Payer: 59 | Source: Ambulatory Visit | Attending: Internal Medicine | Admitting: Internal Medicine

## 2018-07-11 DIAGNOSIS — Z0389 Encounter for observation for other suspected diseases and conditions ruled out: Secondary | ICD-10-CM | POA: Insufficient documentation

## 2018-07-11 DIAGNOSIS — J45909 Unspecified asthma, uncomplicated: Secondary | ICD-10-CM | POA: Insufficient documentation

## 2018-07-11 DIAGNOSIS — E785 Hyperlipidemia, unspecified: Secondary | ICD-10-CM | POA: Diagnosis not present

## 2018-07-11 DIAGNOSIS — M6281 Muscle weakness (generalized): Secondary | ICD-10-CM

## 2018-07-11 DIAGNOSIS — I208 Other forms of angina pectoris: Secondary | ICD-10-CM | POA: Diagnosis not present

## 2018-07-11 DIAGNOSIS — M25511 Pain in right shoulder: Secondary | ICD-10-CM | POA: Diagnosis not present

## 2018-07-11 DIAGNOSIS — I10 Essential (primary) hypertension: Secondary | ICD-10-CM | POA: Insufficient documentation

## 2018-07-11 DIAGNOSIS — I080 Rheumatic disorders of both mitral and aortic valves: Secondary | ICD-10-CM | POA: Diagnosis not present

## 2018-07-11 NOTE — Therapy (Signed)
[facility] PHYSICAL [facility]TS MEDICINE [address] [place], [place], [address] Phone: [phone]   Fax:  [phone]  Physical Therapy Treatment  Patient Details  Name: Alyssa Baird MRN: [id number] Date of Birth: 12/05/1945 Referring Provider: Cassell SmilesJames Bowers, MD   Encounter Date: 07/11/2018    Past Medical History:  Diagnosis Date  . Arthritis   . Asthma    only URI  . Hyperlipidemia   . Hypertension     Past Surgical History:  Procedure Laterality Date  . ABDOMINAL HYSTERECTOMY    . CHOLECYSTECTOMY    . SHOULDER ARTHROSCOPY WITH ROTATOR CUFF REPAIR Right 06/11/2018   Procedure: SHOULDER ARTHROSCOPY WITH ROTATOR CUFF REPAIR;  Surgeon: Lyndle HerrlichBowers, James R, MD;  Location: [facility];  Service: Orthopedics;  Laterality: Right;    There were no vitals filed for this visit.                                          Patient will benefit from skilled therapeutic intervention in order to improve the following deficits and impairments:     Visit Diagnosis: No diagnosis found.     Problem List There are no active problems to display for this patient.   Cashawn Yanko 07/11/2018, 8:26 AM  [facility] [address] [place], [place], [address] Phone: [phone]   Fax:  [phone]  Name: Alyssa Baird MRN: [id number] Date of Birth: 12/05/1945

## 2018-07-11 NOTE — Therapy (Signed)
[facility] PHYS[facility]ORTS MEDICINE [address], [place], [address] Phone: [phone]   Fax:  [phone]  Physical Therapy Treatment  Patient Details  Name: Alyssa Baird MRN: [id number] Date of Birth: 22-May-1946 Referring Provider: Cassell Smiles, MD   Encounter Date: 07/11/2018  PT End of Session - 07/11/18 0828    Visit Number  4    Number of Visits  13    Date for PT Re-Evaluation  08/08/18    Authorization Type  4    Authorization Time Period  of 12 worker's comp    PT Start Time  [phone]    PT Stop Time  0902    PT Time Calculation (min)  34 min    Activity Tolerance  Patient tolerated treatment well    Behavior During Therapy  [facility] for tasks assessed/performed       Past Medical History:  Diagnosis Date  . Arthritis   . Asthma    only URI  . Hyperlipidemia   . Hypertension     Past Surgical History:  Procedure Laterality Date  . ABDOMINAL HYSTERECTOMY    . CHOLECYSTECTOMY    . SHOULDER ARTHROSCOPY WITH ROTATOR CUFF REPAIR Right 06/11/2018   Procedure: SHOULDER ARTHROSCOPY WITH ROTATOR CUFF REPAIR;  Surgeon: Lyndle Herrlich, MD;  Location: [facility] OR[facility];  Service: Orthopedics;  Laterality: Right;    There were no vitals filed for this visit.  Subjective Assessment - 07/11/18 0831    Subjective  R shoulder feels pretty good this morning. Hurts at night. L thumb is sitll kind of numb since the surgery as well as her 2nd and 3rd digits.  No R shoulder pain currently.  R shoulder hurts when she lays on her L side. Wears her sling and abductor pillow.     Pertinent History  S/P R rotator cuff surgery on 06/11/2018. Pt was lifting a bag of linnen at work last year (07/01/2017) which resulted in injury. Thought her shoulder would get better on its own but it got worse.  Had PT at [facility] which did not fix it.   Has not had any PT since her surgery. Was told to perform pendulums at home.  Forgot to use her abduction pillow for  her sling.  Also feels pain at the top of her R shoulder blade.  Pt is R hand dominant.  No falls within the last 6 months.  No fear of falling.     Patient Stated Goals  Wants to be better able to use her R arm to take a shower, and don and doff clothes     Currently in Pain?  No/denies    Pain Onset  More than a month ago                               PT Education - 07/11/18 1155    Education Details  ther-ex/relax for PROM    Person(s) Educated  Patient    Methods  Explanation;Demonstration;Verbal cues;Tactile cues    Comprehension  Verbalized understanding;Returned demonstration         Objective     Per phone conversation with Altamese Cabal PA-C on 06/24/2018: Can use [facility] rotator cuff protocol, no overhead motions, no overhead lifting and just do PROM for the first 6 weeks    Therapeutic exercise   Performed in pain free range  Supine PROM with PT  Flexion  10x3 (not past 90 degrees yet)  scaption 10x3 (not past 90 degrees yet)   ER and IR in scapular plane 10x3each direction  R elbow extension 10x3  Improvedability to keep muscles relaxed with PROMafter min tomod verbal, visual, tactile cues.    Manual therapy   STM R rhomboid and upper trap area, proximal attachment around C6/7 to decrease tension. Pt states it helped with the crick in her neck and decreased the numbness in her R thumb, index and middle fingers.    Continued working on PROM all planes to prevent stiffness within MD protocol. Worked on decreasing tension to R rhomboid and upper trap area proximal attachment around C6/7 which decreased neck discomfort and numbness to R 1st 3 digits. Pt tolerated session well without aggravation of symptoms.       PT Short Term Goals - 06/25/18 1825            PT SHORT TERM GOAL #1   Title  Patient will be independent with her HEP to promote  ability to raise her R arm against gravity and use it for functional tasks when appropriate.     Time  3    Period  Weeks    Status  New    Target Date  07/18/18           PT Long Term Goals - 06/25/18 1826            PT LONG TERM GOAL #1   Title  Patient will improve R shoulder flexion PROM to at least 90 degrees flexion and scaption/abduction to at least 90 degrees to promote ability to raise her R arm, use her R UE to perform functional tasks when appropriate.     Baseline  R shoulder PROM: 75 degrees flexion, 64 degrees abduction (06/25/2018)    Time  6    Period  Weeks    Status  New    Target Date  08/08/18        PT LONG TERM GOAL #2   Title  Patient will improve R shoulder ER PROM to at least 70 degrees and IR PROM to at least 90 degrees to promote ability to use her R UE for self care as well as promote ability to don and doff clothes when appropriate.     Baseline  R shoulder PROM in scapular plane: 35 degrees ER, 72 degrees IR (06/25/2018)    Time  6    Period  Weeks    Status  New    Target Date  08/08/18        PT LONG TERM GOAL #3   Title  Pt will improve her R shoulder FOTO score by at least 20 points as a demonstration of improved function.     Baseline  R shoulder FOTO 4 (06/25/2018)    Time  6    Period  Weeks    Status  New    Target Date  08/08/18                   Plan - 07/11/18 1155    Clinical Impression Statement  Continued working on PROM all planes to prevent stiffness within MD protocol. Worked on decreasing tension to R rhomboid and upper trap area proximal attachment around C6/7 which decreased neck discomfort and numbness to R 1st 3 digits. Pt tolerated session well without aggravation of symptoms.     Rehab Potential  Fair    Clinical Impairments Affecting Rehab  Potential  (-) age, chronicity of condition prior to surgery, healing time; (+) motivated    PT Frequency  2x / week    PT Duration  6 weeks     PT Treatment/Interventions  Manual techniques;Neuromuscular re-education;Therapeutic activities;Therapeutic exercise;Patient/family education;Passive range of motion;Dry needling;Aquatic Therapy;Electrical Stimulation;Iontophoresis 4mg /ml Dexamethasone;Ultrasound    PT Next Visit Plan  PROM R shoulder, no overhead motions, no overhead lifting for first 6 weeks. Can follow [facility] rotator cuff protocol per Altamese Cabal PA-C    Consulted and Agree with Plan of Care  Patient             Patient will benefit from skilled therapeutic intervention in order to improve the following deficits and impairments:  Pain, Postural dysfunction, Improper body mechanics, Impaired UE functional use, Decreased strength, Decreased range of motion  Visit Diagnosis: Right shoulder pain, unspecified chronicity  Muscle weakness (generalized)     Problem List There are no active problems to display for this patient.   Loralyn Freshwater PT, DPT   07/11/2018, 12:02 PM  [facility] PHYSICAL AND SPORTS MEDICINE [address] [place], [address] Phone: [phone]   Fax:  [phone]  Name: Alyssa Baird MRN: [id number] Date of Birth: [birth year]

## 2018-07-11 NOTE — Progress Notes (Signed)
*  PRELIMINARY RESULTS* Echocardiogram 2D Echocardiogram has been performed.  Cristela BlueHege, Marge Vandermeulen 07/11/2018, 12:24 PM

## 2018-07-15 ENCOUNTER — Ambulatory Visit: Payer: PRIVATE HEALTH INSURANCE

## 2018-07-15 DIAGNOSIS — M25511 Pain in right shoulder: Secondary | ICD-10-CM

## 2018-07-15 DIAGNOSIS — M6281 Muscle weakness (generalized): Secondary | ICD-10-CM

## 2018-07-15 NOTE — Therapy (Signed)
[facility] [facility] [address] [place], [place], [address] Phone: [phone]   Fax:  [phone]  Physical Therapy Treatment  Patient Details  Name: Alyssa SlaterSallie L Baird MRN: [id number] Date of Birth: [birth year] Referring Provider: Cassell SmilesJames Bowers, MD   Encounter Date: 07/15/2018  PT End of Session - 07/15/18 1354    Visit Number  5    Number of Visits  13    Date for PT Re-Evaluation  08/08/18    Authorization Type  5    Authorization Time Period  of 12 worker's comp    PT Start Time  1354   pt arrived late   PT Stop Time  1435    PT Time Calculation (min)  41 min    Activity Tolerance  Patient tolerated treatment well    Behavior During Therapy  [facility] tasks assessed/performed       Past Medical History:  Diagnosis Date  . Arthritis   . Asthma    only URI  . Hyperlipidemia   . Hypertension     Past Surgical History:  Procedure Laterality Date  . ABDOMINAL HYSTERECTOMY    . CHOLECYSTECTOMY    . SHOULDER ARTHROSCOPY WITH ROTATOR CUFF REPAIR Right 06/11/2018   Procedure: SHOULDER ARTHROSCOPY WITH ROTATOR CUFF REPAIR;  Surgeon: Lyndle HerrlichBowers, James R, MD;  Location: [facility] ORS;  Service: Orthopedics;  Laterality: Right;    There were no vitals filed for this visit.  Subjective Assessment - 07/15/18 1357    Subjective  R shoulder was hurting a little bit over the weekend. Did her pedulums which helped a little bit. Still a little tender today (anterior shoulder).     Pertinent History  S/P R rotator cuff surgery on 06/11/2018. Pt was lifting a bag of linnen at work last year (07/01/2017) which resulted in injury. Thought her shoulder would get better on its own but it got worse.  Had PT at [facility] which did not fix it.   Has not had any PT since her surgery. Was told to perform pendulums at home.  Forgot to use her abduction pillow for her sling.  Also feels pain at the top of her R shoulder blade.  Pt is R hand dominant.  No  falls within the last 6 months.  No fear of falling.     Patient Stated Goals  Wants to be better able to use her R arm to take a shower, and don and doff clothes     Currently in Pain?  Yes    Pain Score  7     Pain Onset  More than a month ago                               PT Education - 07/15/18 1444    Education Details  ther-ex/relax for PROM    Person(s) Educated  Patient    Methods  Explanation;Demonstration;Tactile cues;Verbal cues    Comprehension  Verbalized understanding;Returned demonstration      Objective     Per phone conversation with Altamese CabalMaurice Jones PA-C on 06/24/2018: Can use [facility] rotator cuff protocol, no overhead motions, no overhead lifting and just do PROM for the first 6 weeks  Next MD visit is on Thursday 07/18/2018   Therapeutic exercise   Performed in pain free range  Supine PROM with PT  Flexion 10x3 (not past 90 degrees yet)  scaption 10x3 (not  past 90 degrees yet)   ER and IR in scapular plane 10x3each direction  R elbow extension 10x3   Decreased R shoulder soreness after aforementioned exercises  Reviewed pendulums per pt request 30 seconds clockwise, 30 seconds counterclockwise, cues for passive movement for 3 sets   Improvedability to keep muscles relaxed with PROMafter min tomod verbal, visual, tactile cues.    Manual therapy  STM R rhomboid and upper trap area, proximal attachment around C6/7 to decrease tension. Pt states it helped with the crick in her neck and decreased the numbness in her R thumb, index and middle fingers.   Seated STM to decrease R cervical paraspinal muscle tension. Decreased R thumb numbness. Neck feels better as well per pt.     Continued working on R shoulder PROM, no overhead motions per PA instructions to prevent stiffness. Decreased R anterior shoulder discomfort following PROM and  decreasing humeral head pressure to anterior shoulder through PROM shoulder flexion and scaption. Decreased R 1st 3 digit paresthesia following STM to decrease tension to R rhomboid, upper trap, and cervical paraspinal muscle tension. Pt tolerated session well without aggravation of symptoms.        PT Short Term Goals - 06/25/18 1825      PT SHORT TERM GOAL #1   Title  Patient will be independent with her HEP to promote ability to raise her R arm against gravity and use it for functional tasks when appropriate.     Time  3    Period  Weeks    Status  New    Target Date  07/18/18        PT Long Term Goals - 06/25/18 1826      PT LONG TERM GOAL #1   Title  Patient will improve R shoulder flexion PROM to at least 90 degrees flexion and scaption/abduction to at least 90 degrees to promote ability to raise her R arm, use her R UE to perform functional tasks when appropriate.     Baseline  R shoulder PROM: 75 degrees flexion, 64 degrees abduction (06/25/2018)    Time  6    Period  Weeks    Status  New    Target Date  08/08/18      PT LONG TERM GOAL #2   Title  Patient will improve R shoulder ER PROM to at least 70 degrees and IR PROM to at least 90 degrees to promote ability to use her R UE for self care as well as promote ability to don and doff clothes when appropriate.     Baseline  R shoulder PROM in scapular plane: 35 degrees ER, 72 degrees IR (06/25/2018)    Time  6    Period  Weeks    Status  New    Target Date  08/08/18      PT LONG TERM GOAL #3   Title  Pt will improve her R shoulder FOTO score by at least 20 points as a demonstration of improved function.     Baseline  R shoulder FOTO 4 (06/25/2018)    Time  6    Period  Weeks    Status  New    Target Date  08/08/18            Plan - 07/15/18 1442    Clinical Impression Statement  Continued working on R shoulder PROM, no overhead motions per PA instructions to prevent stiffness. Decreased R anterior shoulder  discomfort following PROM and decreasing humeral  head pressure to anterior shoulder through PROM shoulder flexion and scaption. Decreased R 1st 3 digit paresthesia following STM to decrease tension to R rhomboid, upper trap, and cervical paraspinal muscle tension. Pt tolerated session well without aggravation of symptoms.       Rehab Potential  Fair    Clinical Impairments Affecting Rehab Potential  (-) age, chronicity of condition prior to surgery, healing time; (+) motivated    PT Frequency  2x / week    PT Duration  6 weeks    PT Treatment/Interventions  Manual techniques;Neuromuscular re-education;Therapeutic activities;Therapeutic exercise;Patient/family education;Passive range of motion;Dry needling;Aquatic Therapy;Electrical Stimulation;Iontophoresis 4mg /ml Dexamethasone;Ultrasound    PT Next Visit Plan  PROM R shoulder, no overhead motions, no overhead lifting for first 6 weeks. Can follow [facility] rotator cuff protocol per Altamese CabalMaurice Jones PA-C    Consulted and Agree with Plan of Care  Patient       Patient will benefit from skilled therapeutic intervention in order to improve the following deficits and impairments:  Pain, Postural dysfunction, Improper body mechanics, Impaired UE functional use, Decreased strength, Decreased range of motion  Visit Diagnosis: Right shoulder pain, unspecified chronicity  Muscle weakness (generalized)     Problem List There are no active problems to display for this patient.   Loralyn FreshwaterMiguel Neithan Day PT, DPT   07/15/2018, 2:46 PM  [place] [facility] [address] [place], [place], [address] Phone: [phone]   Fax:  [phone]  Name: Alyssa SlaterSallie L Wegmann MRN: [id number] Date of Birth: 09-13-1946

## 2018-07-17 ENCOUNTER — Ambulatory Visit: Payer: PRIVATE HEALTH INSURANCE

## 2018-07-17 DIAGNOSIS — M6281 Muscle weakness (generalized): Secondary | ICD-10-CM

## 2018-07-17 DIAGNOSIS — M25511 Pain in right shoulder: Secondary | ICD-10-CM

## 2018-07-17 NOTE — Patient Instructions (Signed)
  With sling on  R hand gentle ball squeeze 10x3. Given as part of HEP

## 2018-07-17 NOTE — Therapy (Signed)
[place] [facility] [address], [place], [address] Phone: [phone]   Fax:  [phone]  Physical Therapy Treatment And Progress Report  Patient Details  Name: Alyssa Baird MRN: [id number] Date of Birth: [birth year] Referring Provider: Kurtis Bushman, MD   Encounter Date: 07/17/2018  PT End of Session - 07/17/18 1116    Visit Number  6    Number of Visits  13    Date for PT Re-Evaluation  08/08/18    Authorization Type  6    Authorization Time Period  of 12 worker's comp    PT Start Time  1116    PT Stop Time  1159    PT Time Calculation (min)  43 min    Activity Tolerance  Patient tolerated treatment well    Behavior During Therapy  [facility] for tasks assessed/performed       Past Medical History:  Diagnosis Date  . Arthritis   . Asthma    only URI  . Hyperlipidemia   . Hypertension     Past Surgical History:  Procedure Laterality Date  . ABDOMINAL HYSTERECTOMY    . CHOLECYSTECTOMY    . SHOULDER ARTHROSCOPY WITH ROTATOR CUFF REPAIR Right 06/11/2018   Procedure: SHOULDER ARTHROSCOPY WITH ROTATOR CUFF REPAIR;  Surgeon: Lovell Sheehan, MD;  Location: [facility] ORS;  Service: Orthopedics;  Laterality: Right;    There were no vitals filed for this visit.  Subjective Assessment - 07/17/18 1119    Subjective  Pt states that her neck is getting better. The numbness in her R 1st 3 fingers is better. More feeling in them now.  R shoulder is doing a little bit better. A little sore.  5/10 soreness currently.     Pertinent History  S/P R rotator cuff surgery on 06/11/2018. Pt was lifting a bag of linnen at work last year (07/01/2017) which resulted in injury. Thought her shoulder would get better on its own but it got worse.  Had PT at [facility] which did not fix it.   Has not had any PT since her surgery. Was told to perform pendulums at home.  Forgot to use her abduction pillow for her sling.  Also feels pain at the  top of her R shoulder blade.  Pt is R hand dominant.  No falls within the last 6 months.  No fear of falling.     Patient Stated Goals  Wants to be better able to use her R arm to take a shower, and don and doff clothes     Currently in Pain?  Yes    Pain Score  5     Pain Onset  More than a month ago         [facility] PT Assessment - 07/17/18 1130      PROM   Right Shoulder Flexion  90 Degrees    Right Shoulder ABduction  90 Degrees   supine scaption   Right Shoulder Internal Rotation  76 Degrees   in scapular plane   Right Shoulder External Rotation  54 Degrees   in scapular plane                          PT Education - 07/17/18 1220    Education Details  ther-ex    Person(s) Educated  Patient    Methods  Explanation;Demonstration;Tactile cues;Verbal cues    Comprehension  Returned demonstration;Verbalized understanding  Objective   Per phone conversation with Carlynn Spry PA-C on 06/24/2018: Can use [facility] rotator cuff protocol, no overhead motions, no overhead lifting and just do PROM for the first 6 weeks  Next MD visit is on Thursday 07/18/2018  S/P 5 weeks  Therapeutic exercise   Performed in pain free range  Supine PROM with PT  Flexion 10x3 (not past 90 degrees yet)  scaption 10x3 (not past 90 degrees yet)   ER and IR in scapular plane 10x3each direction  R elbow extension 10x3  With sling on  R hand gentle ball squeeze 10x3    Scapular retraction, small comfortable movements. 10x3     Improvedability to keep muscles relaxed with PROMafter min tomod verbal, visual, tactile cues.   Able to achieve 90 degrees PROM scaption and flexion in supine    Manual therapy  STM R rhomboid and upper trap area, proximal attachment around C6/7 to decrease tension. Pt states it helped with the crick in her neck and decreased the numbness in her R thumb,  index and middle fingers.  Seated STM to decrease R cervical paraspinal muscle tension. Decreased 1st 3 finger numbness.    Continued working on R shoulder PROM with no overhead motions based on instructions from surgeon's office. Pt demonstrates improved R shoulder flexion, scaption, and ER PROM since initial evaluation. Decreased R hand paresthesia and decreased neck pain following treatment to decrease tension to her cervical paraspinal and rhomboid/upper trap muscles. Worked on gentle scapular retractions and hand ball squeezes today based on the [facility] protocol (approved by PA) to promote scapular positioning and maintain function of R hand. Pt will benefit from continued skilled physical therapy services to improve ROM, strength, ability to use her R UE (when appropriate) to perform functional tasks. Pt tolerated session well without aggravation of symptoms.      PT Short Term Goals - 07/17/18 1220      PT SHORT TERM GOAL #1   Title  Patient will be independent with her HEP to promote ability to raise her R arm against gravity and use it for functional tasks when appropriate.     Time  3    Period  Weeks    Status  On-going    Target Date  07/18/18        PT Long Term Goals - 07/17/18 1221      PT LONG TERM GOAL #1   Title  Patient will improve R shoulder flexion PROM to at least 90 degrees flexion and scaption/abduction to at least 90 degrees to promote ability to raise her R arm, use her R UE to perform functional tasks when appropriate.     Baseline  R shoulder PROM: 75 degrees flexion, 64 degrees abduction (06/25/2018); 90 degrees PROM for both flexion and scaption (07/17/2018)    Time  6    Period  Weeks    Status  Achieved    Target Date  08/08/18      PT LONG TERM GOAL #2   Title  Patient will improve R shoulder ER PROM to at least 70 degrees and IR PROM to at least 90 degrees to promote ability to use her R UE for self care as well as promote  ability to don and doff clothes when appropriate.     Baseline  R shoulder PROM in scapular plane: 35 degrees ER, 72 degrees IR (06/25/2018); 54 degrees ER, 76 degrees IR (07/17/2018)    Time  6  Period  Weeks    Status  Partially Met    Target Date  08/08/18      PT LONG TERM GOAL #3   Title  Pt will improve her R shoulder FOTO score by at least 20 points as a demonstration of improved function.     Baseline  R shoulder FOTO 4 (06/25/2018)    Time  6    Period  Weeks    Status  On-going    Target Date  08/08/18      PT LONG TERM GOAL #4   Title  Patient will improve R shoulder flexion and scaption AAROM to 130 degrees or more to promote ability to raise her R arm when appropriate.     Baseline  AAROM not yet performed secondary to PROM for first 6 weeks (07/17/2018)    Time  7    Period  Weeks    Status  New    Target Date  09/05/18      PT LONG TERM GOAL #5   Title  Patient will have at least 4/5 R shoulder flexion, scaption, ER and IR strength to promote ability to utilize her R UE to perform funcitonal tasks when appropriate.     Baseline  Strength not tested to allow for more healing (07/17/2018)    Time  13    Period  Weeks    Status  New    Target Date  10/17/18            Plan - 07/17/18 1159    Clinical Impression Statement  Continued working on R shoulder PROM with no overhead motions based on instructions from surgeon's office. Pt demonstrates improved R shoulder flexion, scaption, and ER PROM since initial evaluation. Decreased R hand paresthesia and decreased neck pain following treatment to decrease tension to her cervical paraspinal and rhomboid/upper trap muscles. Worked on gentle scapular retractions and hand ball squeezes today based on the [facility] protocol (approved by PA) to promote scapular positioning and maintain function of R hand. Pt will benefit from continued skilled physical therapy services to improve ROM, strength, ability to  use her R UE (when appropriate) to perform functional tasks. Pt tolerated session well without aggravation of symptoms.     History and Personal Factors relevant to plan of care:  Chronicity of condition prior to surgery, pain, weakness, TTP, difficulty reaching, using her R UE for self care, donning and doffing clothes    Clinical Presentation  Stable    Clinical Presentation due to:  improved PROM since eval    Clinical Decision Making  Low    Rehab Potential  Fair    Clinical Impairments Affecting Rehab Potential  (-) age, chronicity of condition prior to surgery, healing time; (+) motivated    PT Frequency  2x / week    PT Duration  6 weeks    PT Treatment/Interventions  Manual techniques;Neuromuscular re-education;Therapeutic activities;Therapeutic exercise;Patient/family education;Passive range of motion;Dry needling;Aquatic Therapy;Electrical Stimulation;Iontophoresis '4mg'$ /ml Dexamethasone;Ultrasound    PT Next Visit Plan  PROM R shoulder, no overhead motions, no overhead lifting for first 6 weeks. Can follow [facility] rotator cuff protocol per Carlynn Spry PA-C    Consulted and Agree with Plan of Care  Patient       Patient will benefit from skilled therapeutic intervention in order to improve the following deficits and impairments:  Pain, Postural dysfunction, Improper body mechanics, Impaired UE functional use, Decreased strength, Decreased range of motion  Visit Diagnosis: Right  shoulder pain, unspecified chronicity  Muscle weakness (generalized)     Problem List There are no active problems to display for this patient.   Joneen Boers PT, DPT   07/17/2018, 12:43 PM  [place] [facility] [address] [place], [address] Phone: [phone]   Fax:  [phone]  Name: Alyssa Baird  MRN: [id number] Date of Birth: 1946/02/19

## 2018-07-18 ENCOUNTER — Ambulatory Visit: Payer: PRIVATE HEALTH INSURANCE

## 2018-07-22 ENCOUNTER — Ambulatory Visit: Payer: PRIVATE HEALTH INSURANCE

## 2018-07-22 DIAGNOSIS — M25511 Pain in right shoulder: Secondary | ICD-10-CM

## 2018-07-22 DIAGNOSIS — M6281 Muscle weakness (generalized): Secondary | ICD-10-CM

## 2018-07-22 NOTE — Therapy (Signed)
[place] [facility] [address] [place], [address] Phone: [phone]   Fax:  [phone]  Physical Therapy Treatment  Patient Details  Name: Alyssa Baird MRN: [id number] Date of Birth: 02/13/1946 Referring Provider: Kurtis Bushman, MD   Encounter Date: 07/22/2018  PT End of Session - 07/22/18 1351    Visit Number  7    Number of Visits  13    Date for PT Re-Evaluation  08/08/18    Authorization Type  7    Authorization Time Period  of 12 worker's comp    PT Start Time  1350    PT Stop Time  1430    PT Time Calculation (min)  40 min    Activity Tolerance  Patient tolerated treatment well    Behavior During Therapy  [facility] for tasks assessed/performed       Past Medical History:  Diagnosis Date  . Arthritis   . Asthma    only URI  . Hyperlipidemia   . Hypertension     Past Surgical History:  Procedure Laterality Date  . ABDOMINAL HYSTERECTOMY    . CHOLECYSTECTOMY    . SHOULDER ARTHROSCOPY WITH ROTATOR CUFF REPAIR Right 06/11/2018   Procedure: SHOULDER ARTHROSCOPY WITH ROTATOR CUFF REPAIR;  Surgeon: Lovell Sheehan, MD;  Location: [facility] ORS;  Service: Orthopedics;  Laterality: Right;    There were no vitals filed for this visit.  Subjective Assessment - 07/22/18 1351    Subjective  Went back to work today. MD told her not to use her R hand. Just use her L hand. R shoulder stiffness when she raises it up to the side. The neck feels better. The thumb feels a little numb but the rest of her fingers feel fine.  Saw Dr. Harlow Mares last Thursday who said that her shoulder is healing up good.  MD told her she could take off her sling.   Has not worn her sling since her last appt on Thursday.     Pertinent History  S/P R rotator cuff surgery on 06/11/2018. Pt was lifting a bag of linnen at work last year (07/01/2017) which resulted in injury. Thought her shoulder would get better on its own but it got worse.  Had PT at  [facility] which did not fix it.   Has not had any PT since her surgery. Was told to perform pendulums at home.  Forgot to use her abduction pillow for her sling.  Also feels pain at the top of her R shoulder blade.  Pt is R hand dominant.  No falls within the last 6 months.  No fear of falling.     Patient Stated Goals  Wants to be better able to use her R arm to take a shower, and don and doff clothes     Currently in Pain?  No/denies    Pain Onset  More than a month ago                               PT Education - 07/22/18 1543    Education Details  ther-ex    Person(s) Educated  Patient    Methods  Explanation;Demonstration;Tactile cues;Verbal cues    Comprehension  Returned demonstration;Verbalized understanding       Objective   Per phone conversation with Carlynn Spry PA-C on 06/24/2018: Can use [place] rotator cuff protocol, no overhead motions, no overhead  lifting and just do PROM for the first 6 weeks  S/P 6 weeks tomorrow Tuesday  Next MD appt is in 6 weeks   Therapeutic exercise   Performed in pain free range  Supine PROM with PT  Flexion 10x3 (not past 90 degrees yet)  scaption 10x3 (not past 90 degrees yet)   ER and IR in scapular plane 10x3each direction  R elbow extension 10x3               Scapular retraction, small comfortable movements. 10x3 with 5 second holds                R hand gentle ball squeeze 10x3 with 5 second holds   try supine AAROM flexion and submax isometrics next visit if appropriate   Improvedability to keep muscles relaxed with PROMafter min tomod verbal, visual, tactile cues.   Manual therapy  STM R rhomboid and upper trap area, proximal attachment around C6/7 to decrease tension. Pt states it helped with the crick in her neck and decreased the numbness in her R thumb, index and middle fingers.  Seated STM  to decrease R cervical paraspinal muscle tension.    Continued working on PROM R shoulder flexion, scaption, ER, IR to help decrease stiffness secondary to pt not yet 6 weeks post op. Will be 6 weeks post op tomorrow. R shoulder stiffness palpated initially with ROM exercises but stiffness decreased afterwards. Pt continues to decrease R hand paresthesia with manual therapy to decrease tension to her R rhomboid/upper trap area. Pt tolerated session well without aggravation of symptoms.     PT Short Term Goals - 07/17/18 1220      PT SHORT TERM GOAL #1   Title  Patient will be independent with her HEP to promote ability to raise her R arm against gravity and use it for functional tasks when appropriate.     Time  3    Period  Weeks    Status  On-going    Target Date  07/18/18        PT Long Term Goals - 07/17/18 1221      PT LONG TERM GOAL #1   Title  Patient will improve R shoulder flexion PROM to at least 90 degrees flexion and scaption/abduction to at least 90 degrees to promote ability to raise her R arm, use her R UE to perform functional tasks when appropriate.     Baseline  R shoulder PROM: 75 degrees flexion, 64 degrees abduction (06/25/2018); 90 degrees PROM for both flexion and scaption (07/17/2018)    Time  6    Period  Weeks    Status  Achieved    Target Date  08/08/18      PT LONG TERM GOAL #2   Title  Patient will improve R shoulder ER PROM to at least 70 degrees and IR PROM to at least 90 degrees to promote ability to use her R UE for self care as well as promote ability to don and doff clothes when appropriate.     Baseline  R shoulder PROM in scapular plane: 35 degrees ER, 72 degrees IR (06/25/2018); 54 degrees ER, 76 degrees IR (07/17/2018)    Time  6    Period  Weeks    Status  Partially Met    Target Date  08/08/18      PT LONG TERM GOAL #3   Title  Pt will improve her R shoulder FOTO score by at least 20 points  as a demonstration of improved function.      Baseline  R shoulder FOTO 4 (06/25/2018)    Time  6    Period  Weeks    Status  On-going    Target Date  08/08/18      PT LONG TERM GOAL #4   Title  Patient will improve R shoulder flexion and scaption AAROM to 130 degrees or more to promote ability to raise her R arm when appropriate.     Baseline  AAROM not yet performed secondary to PROM for first 6 weeks (07/17/2018)    Time  7    Period  Weeks    Status  New    Target Date  09/05/18      PT LONG TERM GOAL #5   Title  Patient will have at least 4/5 R shoulder flexion, scaption, ER and IR strength to promote ability to utilize her R UE to perform funcitonal tasks when appropriate.     Baseline  Strength not tested to allow for more healing (07/17/2018)    Time  13    Period  Weeks    Status  New    Target Date  10/17/18            Plan - 07/22/18 1543    Clinical Impression Statement  Continued working on PROM R shoulder flexion, scaption, ER, IR to help decrease stiffness secondary to pt not yet 6 weeks post op. Will be 6 weeks post op tomorrow. R shoulder stiffness palpated initially with ROM exercises but stiffness decreased afterwards. Pt continues to decrease R hand paresthesia with manual therapy to decrease tension to her R rhomboid/upper trap area. Pt tolerated session well without aggravation of symptoms.     Rehab Potential  Fair    Clinical Impairments Affecting Rehab Potential  (-) age, chronicity of condition prior to surgery, healing time; (+) motivated    PT Frequency  2x / week    PT Duration  6 weeks    PT Treatment/Interventions  Manual techniques;Neuromuscular re-education;Therapeutic activities;Therapeutic exercise;Patient/family education;Passive range of motion;Dry needling;Aquatic Therapy;Electrical Stimulation;Iontophoresis '4mg'$ /ml Dexamethasone;Ultrasound    PT Next Visit Plan  PROM R shoulder, no overhead motions, no overhead lifting for first 6 weeks. Can follow [place] rotator cuff protocol  per Carlynn Spry PA-C    Consulted and Agree with Plan of Care  Patient       Patient will benefit from skilled therapeutic intervention in order to improve the following deficits and impairments:  Pain, Postural dysfunction, Improper body mechanics, Impaired UE functional use, Decreased strength, Decreased range of motion  Visit Diagnosis: Right shoulder pain, unspecified chronicity  Muscle weakness (generalized)     Problem List There are no active problems to display for this patient.   Joneen Boers PT, DPT   07/22/2018, 4:18 PM  [place] [facility] [address] [place], [address] Phone: [phone]   Fax:  [phone]  Name: Alyssa Baird MRN: [id number] Date of Birth: 1946-08-14

## 2018-07-25 ENCOUNTER — Ambulatory Visit: Payer: PRIVATE HEALTH INSURANCE

## 2018-07-25 DIAGNOSIS — M6281 Muscle weakness (generalized): Secondary | ICD-10-CM

## 2018-07-25 DIAGNOSIS — M25511 Pain in right shoulder: Secondary | ICD-10-CM | POA: Diagnosis not present

## 2018-07-25 IMAGING — MG MM DIGITAL SCREENING BILAT W/ TOMO W/ CAD
8 of 12 series · 8 of 28 positions shown · non-contrast
Comparison: Previous exam(s).

CLINICAL DATA: Screening.

EXAM:
DIGITAL SCREENING BILATERAL MAMMOGRAM WITH TOMO AND CAD

[L MLO synth-2D]
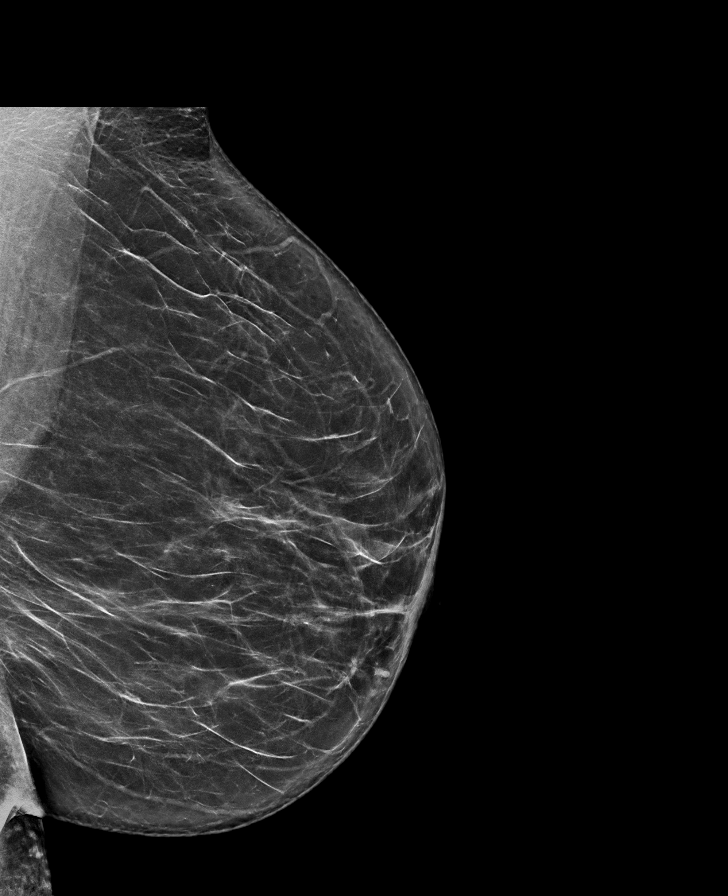

[R MLO]
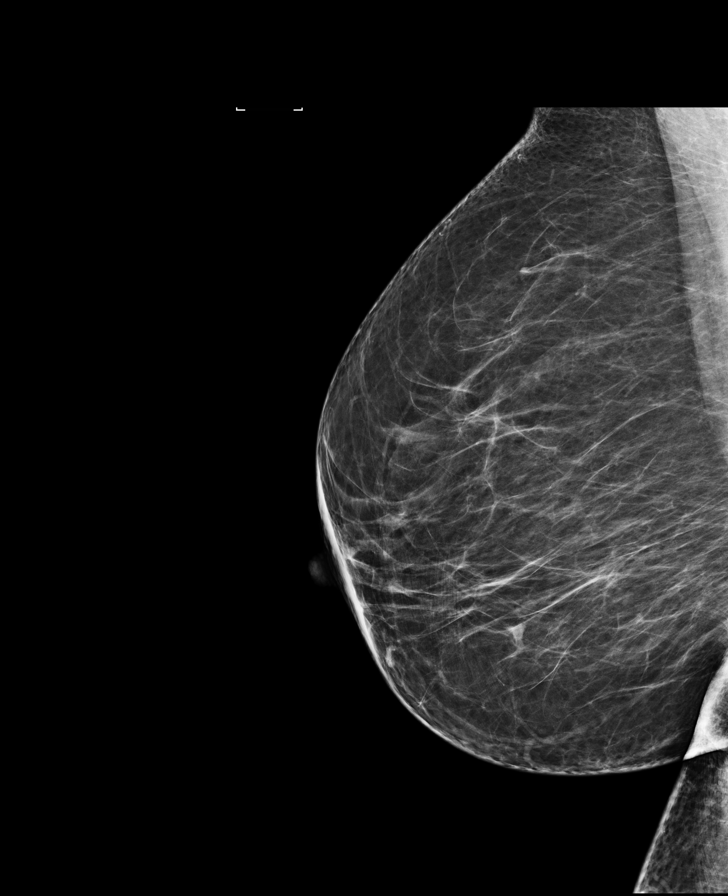

[L CC synth-2D]
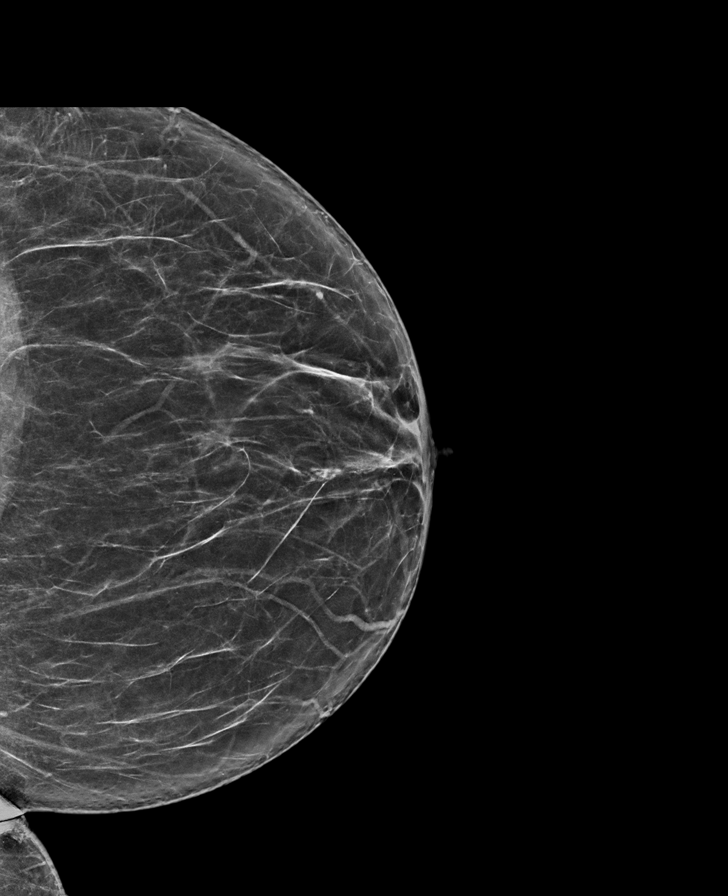

[R CC synth-2D]
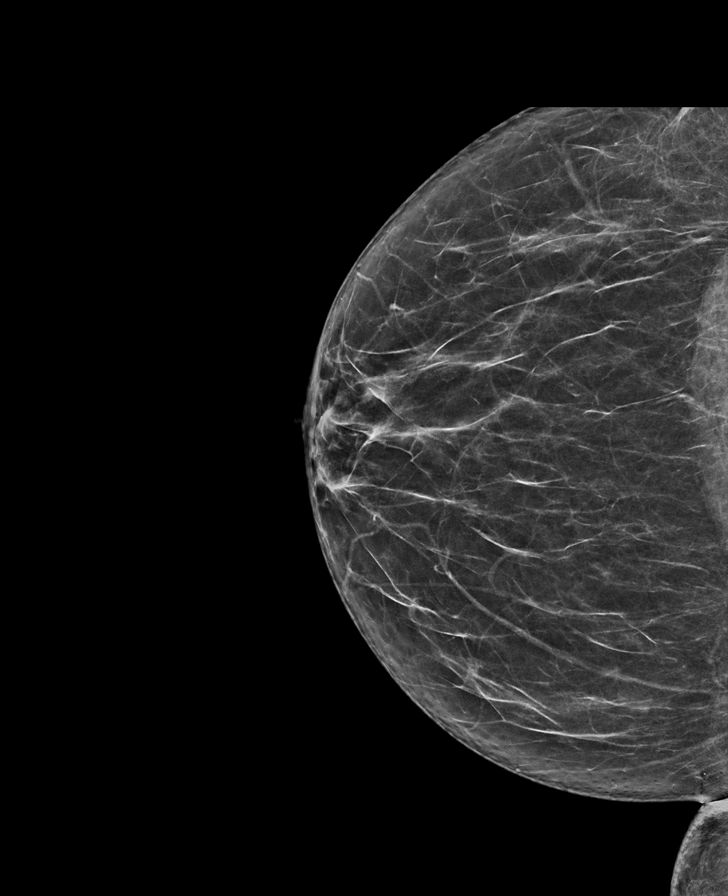

[R MLO synth-2D]
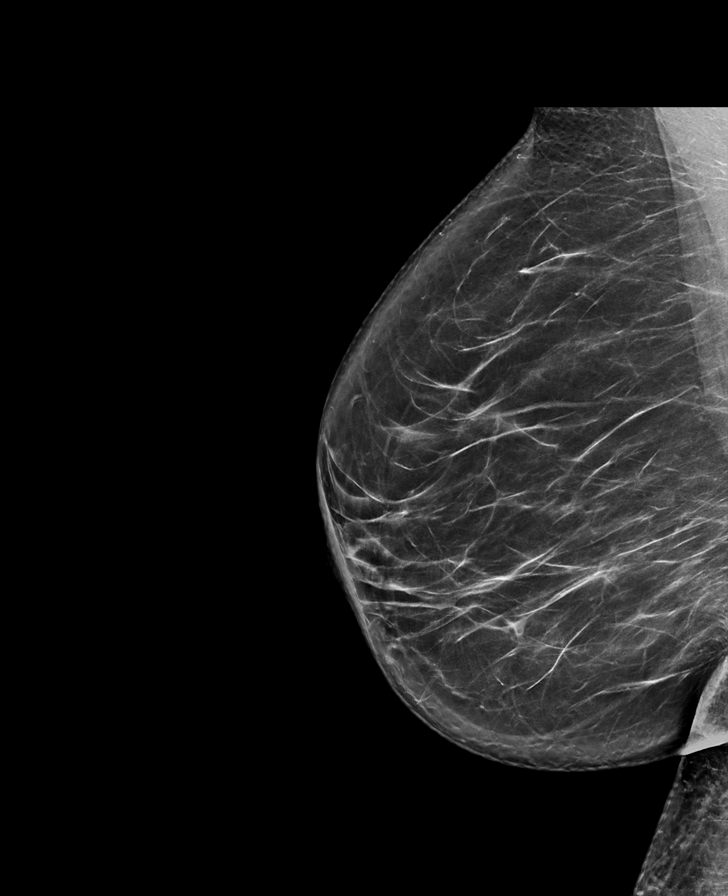

[L CC]
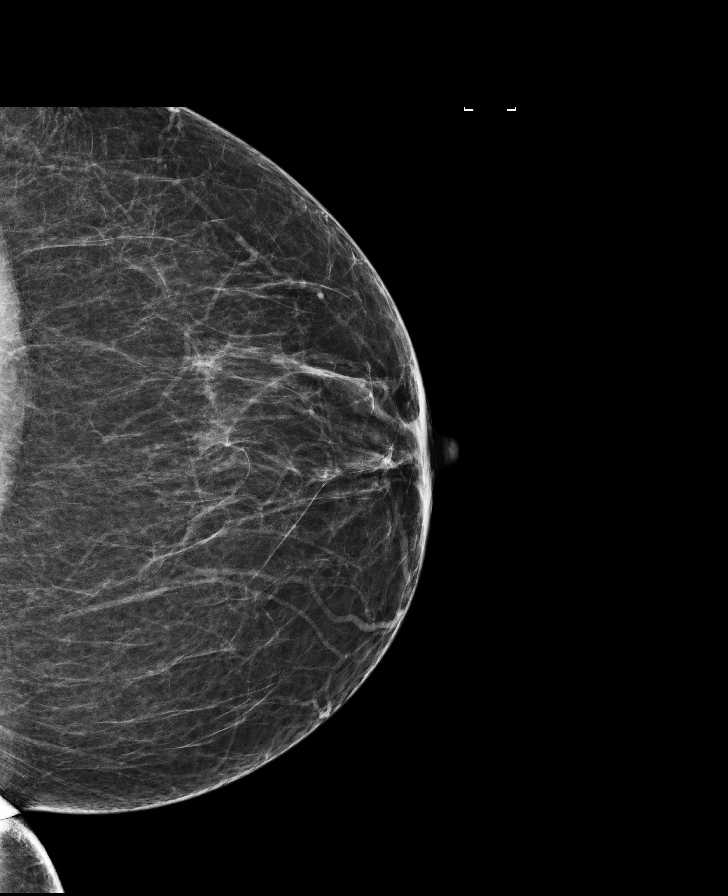

[L MLO]
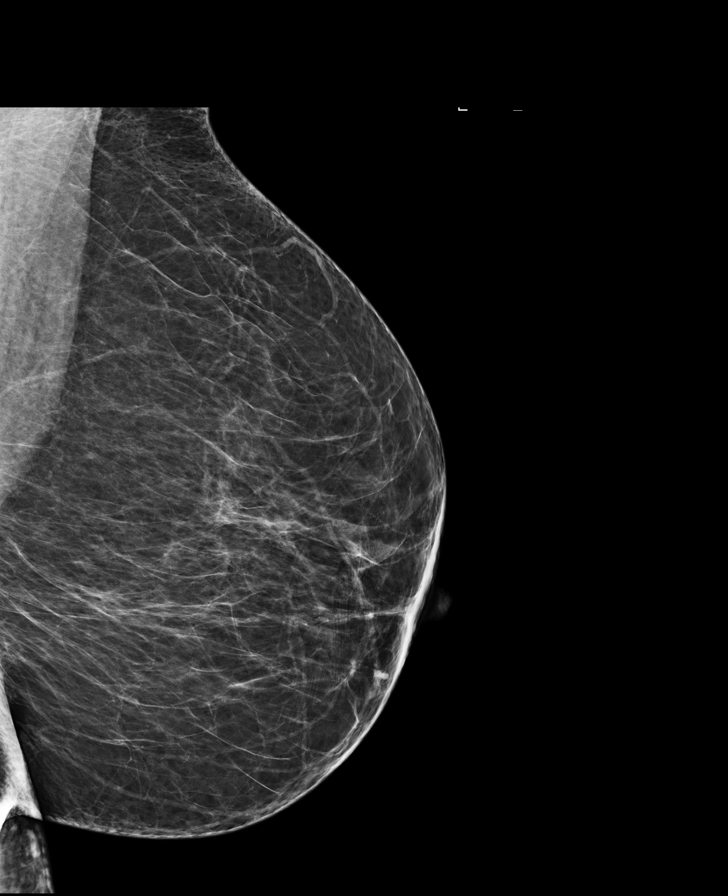

[R CC]
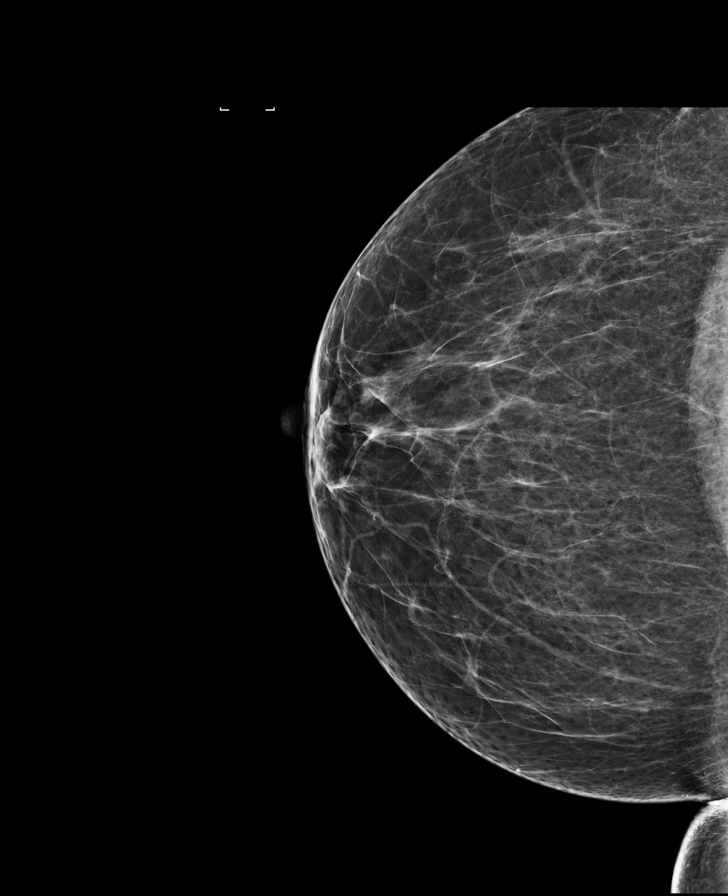

[8 of 28 positions shown; findings below may reference images not displayed]

ACR Breast Density Category b: There are scattered areas of
fibroglandular density.
FINDINGS: There are no findings suspicious for malignancy. Images were
processed with CAD.
IMPRESSION: No mammographic evidence of malignancy. A result letter of this
screening mammogram will be mailed directly to the patient.

RECOMMENDATION:
Screening mammogram in one year. (Code:[id number]-775)

BI-RADS CATEGORY  1: Negative.

## 2018-07-25 NOTE — Therapy (Signed)
[place] [facility] [address], [place], [address] Phone: [phone]   Fax:  [phone]  Physical Therapy Treatment  Patient Details  Name: Alyssa Baird MRN: [id number] Date of Birth: June 23, 1946 Referring Provider: Kurtis Bushman, MD   Encounter Date: 07/25/2018  PT End of Session - 07/25/18 1435    Visit Number  8    Number of Visits  13    Date for PT Re-Evaluation  08/08/18    Authorization Type  8    Authorization Time Period  of 12 worker's comp    PT Start Time  1436    PT Stop Time  1516    PT Time Calculation (min)  40 min    Activity Tolerance  Patient tolerated treatment well    Behavior During Therapy  [facility] for tasks assessed/performed       Past Medical History:  Diagnosis Date  . Arthritis   . Asthma    only URI  . Hyperlipidemia   . Hypertension     Past Surgical History:  Procedure Laterality Date  . ABDOMINAL HYSTERECTOMY    . CHOLECYSTECTOMY    . SHOULDER ARTHROSCOPY WITH ROTATOR CUFF REPAIR Right 06/11/2018   Procedure: SHOULDER ARTHROSCOPY WITH ROTATOR CUFF REPAIR;  Surgeon: Lovell Sheehan, MD;  Location: [facility] ORS;  Service: Orthopedics;  Laterality: Right;    There were no vitals filed for this visit.  Subjective Assessment - 07/25/18 1438    Subjective  R shoulder is kind of sore. Might be the way she turns her body when she uses her L hand. Took her tramadol at 9 am this morning.     Pertinent History  S/P R rotator cuff surgery on 06/11/2018. Pt was lifting a bag of linnen at work last year (07/01/2017) which resulted in injury. Thought her shoulder would get better on its own but it got worse.  Had PT at [facility] which did not fix it.   Has not had any PT since her surgery. Was told to perform pendulums at home.  Forgot to use her abduction pillow for her sling.  Also feels pain at the top of her R shoulder blade.  Pt is R hand dominant.  No falls within the last 6 months.   No fear of falling.     Patient Stated Goals  Wants to be better able to use her R arm to take a shower, and don and doff clothes     Currently in Pain?  Yes    Pain Score  7    Pain that is not that bad   Pain Onset  More than a month ago                               PT Education - 07/25/18 1936    Education Details  ther-ex, HEP    Person(s) Educated  Patient    Methods  Explanation;Demonstration;Tactile cues;Verbal cues    Comprehension  Returned demonstration;Verbalized understanding      Objective  Medbridge Access Code: [email or web address]    Per phone conversation with Carlynn Spry PA-C on 06/24/2018: Can use [facility] rotator cuff protocol, no overhead motions, no overhead lifting and just do PROM for the first 6 weeks  S/P 6 weeks    Next MD appt is in 6 weeks   Therapeutic exercise  Bent over R scapular retraction 10x3  Standing bilateral scapular retraction 10x, then 10x2 with 5 second holds   Supine Shoulder Flexion AAROM using L UE 10x3  Supine R shoulder scaption AAROM with PT 10x3  Stiffness palpated  Seated with R arm in neutral: submax isometrics (pain-free level of effort) ER and IR 5x5 seconds for 3 sets each way based on [facility] protocol   No R shoulder pain at rest after exercises   Improved exercise technique, movement at target joints, use of target muscles after mod verbal, visual, tactile cues.      Worked on gentle AAROM and submax isometrics per [facility] protocol to decrease stiffness, decrease weakness, and improve ability to raise her R arm. Decreased R shoulder pain after exercises.       PT Short Term Goals - 07/17/18 1220      PT SHORT TERM GOAL #1   Title  Patient will be independent with her HEP to promote ability to raise her R arm against gravity and use it for functional tasks when appropriate.     Time  3    Period  Weeks    Status  On-going     Target Date  07/18/18        PT Long Term Goals - 07/17/18 1221      PT LONG TERM GOAL #1   Title  Patient will improve R shoulder flexion PROM to at least 90 degrees flexion and scaption/abduction to at least 90 degrees to promote ability to raise her R arm, use her R UE to perform functional tasks when appropriate.     Baseline  R shoulder PROM: 75 degrees flexion, 64 degrees abduction (06/25/2018); 90 degrees PROM for both flexion and scaption (07/17/2018)    Time  6    Period  Weeks    Status  Achieved    Target Date  08/08/18      PT LONG TERM GOAL #2   Title  Patient will improve R shoulder ER PROM to at least 70 degrees and IR PROM to at least 90 degrees to promote ability to use her R UE for self care as well as promote ability to don and doff clothes when appropriate.     Baseline  R shoulder PROM in scapular plane: 35 degrees ER, 72 degrees IR (06/25/2018); 54 degrees ER, 76 degrees IR (07/17/2018)    Time  6    Period  Weeks    Status  Partially Met    Target Date  08/08/18      PT LONG TERM GOAL #3   Title  Pt will improve her R shoulder FOTO score by at least 20 points as a demonstration of improved function.     Baseline  R shoulder FOTO 4 (06/25/2018)    Time  6    Period  Weeks    Status  On-going    Target Date  08/08/18      PT LONG TERM GOAL #4   Title  Patient will improve R shoulder flexion and scaption AAROM to 130 degrees or more to promote ability to raise her R arm when appropriate.     Baseline  AAROM not yet performed secondary to PROM for first 6 weeks (07/17/2018)    Time  7    Period  Weeks    Status  New    Target Date  09/05/18      PT LONG TERM GOAL #5   Title  Patient will have at least  4/5 R shoulder flexion, scaption, ER and IR strength to promote ability to utilize her R UE to perform funcitonal tasks when appropriate.     Baseline  Strength not tested to allow for more healing (07/17/2018)    Time  13    Period  Weeks    Status  New     Target Date  10/17/18            Plan - 07/25/18 1433    Clinical Impression Statement  Worked on gentle AAROM and submax isometrics per [facility] protocol to decrease stiffness, decrease weakness, and improve ability to raise her R arm. Decreased R shoulder pain after exercises.     Rehab Potential  Fair    Clinical Impairments Affecting Rehab Potential  (-) age, chronicity of condition prior to surgery, healing time; (+) motivated    PT Frequency  2x / week    PT Duration  6 weeks    PT Treatment/Interventions  Manual techniques;Neuromuscular re-education;Therapeutic activities;Therapeutic exercise;Patient/family education;Passive range of motion;Dry needling;Aquatic Therapy;Electrical Stimulation;Iontophoresis 29m/ml Dexamethasone;Ultrasound    PT Next Visit Plan  PROM R shoulder, no overhead motions, no overhead lifting for first 6 weeks. Can follow [place] protocol per MCarlynn SpryPA-C    Consulted and Agree with Plan of Care  Patient       Patient will benefit from skilled therapeutic intervention in order to improve the following deficits and impairments:  Pain, Postural dysfunction, Improper body mechanics, Impaired UE functional use, Decreased strength, Decreased range of motion  Visit Diagnosis: Right shoulder pain, unspecified chronicity  Muscle weakness (generalized)     Problem List There are no active problems to display for this patient.   MJoneen BoersPT, DPT   07/25/2018, 7:42 PM  [facility] [address] [place] [address]Phone: [phone]  Fax:  [phone] Name: Alyssa PETERKAMRN: [id number] of Birth: 107-29-1947

## 2018-07-25 NOTE — Patient Instructions (Addendum)
Medbridge Access Code: [email or web address]    Standing Scapular Retraction   10x3 with 5 second holds    Supine Shoulder Flexion AAROM  10x3

## 2018-07-29 ENCOUNTER — Ambulatory Visit: Payer: PRIVATE HEALTH INSURANCE

## 2018-07-30 ENCOUNTER — Ambulatory Visit: Payer: PRIVATE HEALTH INSURANCE

## 2018-07-30 DIAGNOSIS — M25511 Pain in right shoulder: Secondary | ICD-10-CM | POA: Diagnosis not present

## 2018-07-30 DIAGNOSIS — M6281 Muscle weakness (generalized): Secondary | ICD-10-CM

## 2018-07-30 NOTE — Therapy (Signed)
[facility] [address] [place], [address] Phone: [phone]   Fax:  [phone]  Physical Therapy Treatment  Patient Details  Name: Alyssa Baird MRN: [id number] Date of Birth: 1946-11-23 Referring Provider: Kurtis Bushman, MD   Encounter Date: 07/30/2018  PT End of Session - 07/30/18 1651    Visit Number  9    Number of Visits  13    Date for PT Re-Evaluation  08/08/18    Authorization Type  9    Authorization Time Period  of 12 worker's comp    PT Start Time  1651    PT Stop Time  7793    PT Time Calculation (min)  43 min    Activity Tolerance  Patient tolerated treatment well    Behavior During Therapy  [facility] for tasks assessed/performed       Past Medical History:  Diagnosis Date  . Arthritis   . Asthma    only URI  . Hyperlipidemia   . Hypertension     Past Surgical History:  Procedure Laterality Date  . ABDOMINAL HYSTERECTOMY    . CHOLECYSTECTOMY    . SHOULDER ARTHROSCOPY WITH ROTATOR CUFF REPAIR Right 06/11/2018   Procedure: SHOULDER ARTHROSCOPY WITH ROTATOR CUFF REPAIR;  Surgeon: Lovell Sheehan, MD;  Location: [facility] ORS;  Service: Orthopedics;  Laterality: Right;    There were no vitals filed for this visit.  Subjective Assessment - 07/30/18 1653    Subjective  R shoulder feels good. Kind of tossed and turned with it last night. Slight pain in her R thumb area.     Pertinent History  S/P R rotator cuff surgery on 06/11/2018. Pt was lifting a bag of linnen at work last year (07/01/2017) which resulted in injury. Thought her shoulder would get better on its own but it got worse.  Had PT at [facility] which did not fix it.   Has not had any PT since her surgery. Was told to perform pendulums at home.  Forgot to use her abduction pillow for her sling.  Also feels pain at the top of her R shoulder blade.  Pt is R hand dominant.  No falls within the last 6 months.  No fear of falling.      Patient Stated Goals  Wants to be better able to use her R arm to take a shower, and don and doff clothes     Currently in Pain?  Other (Comment)   No R shoulder pain   Pain Onset  More than a month ago                               PT Education - 07/30/18 1656    Education Details  ther-ex    Person(s) Educated  Patient    Methods  Explanation;Demonstration;Tactile cues;Verbal cues    Comprehension  Returned demonstration;Verbalized understanding        Objective  Medbridge Access Code: [email or web address]    Per phone conversation with Carlynn Spry PA-C on 06/24/2018: Can use [facility] rotator cuff protocol, no overhead motions, no overhead lifting and just do PROM for the first 6 weeks  S/P 7weeks    Next MD appt is in 6 weeks  Therapeutic exercise  Bent over R scapular retraction 10x3 with 5 second holds  Supine Shoulder Flexion AAROM using L UE 10x3  101 degrees R shoulder flexion AAROM  Supine R shoulder scaption AAROM with PT 10x3   Seated R shoulder scaption AAROM using SPC (tip on floor) 8x, then 5x. R anterior shoulder soreness, eases with rest   Seated with R arm in neutral: submax isometrics (pain-free level of effort) ER and IR 5x5 seconds for 2 sets each way then 10x5 seconds each way based on [facility] hospital protocol   Reclined AAROM R Shoulder flexion 5x     Improved exercise technique, movement at target joints, use of target muscles after mod verbal, visual, tactile cues.    Manual therapy   Seated STM R rhomboid/upper trap muscle area to decrease tension   Continued working on ROM to promote ability to raise her R arm when appropriate. Able to achieve 101 degrees R shoulder flexion AAROM in supine. Continued working on STM to R upper trap/rhomboid muscle area to decrease tension.       PT Short Term Goals - 07/17/18 1220      PT SHORT TERM GOAL #1   Title  Patient will be independent  with her HEP to promote ability to raise her R arm against gravity and use it for functional tasks when appropriate.     Time  3    Period  Weeks    Status  On-going    Target Date  07/18/18        PT Long Term Goals - 07/17/18 1221      PT LONG TERM GOAL #1   Title  Patient will improve R shoulder flexion PROM to at least 90 degrees flexion and scaption/abduction to at least 90 degrees to promote ability to raise her R arm, use her R UE to perform functional tasks when appropriate.     Baseline  R shoulder PROM: 75 degrees flexion, 64 degrees abduction (06/25/2018); 90 degrees PROM for both flexion and scaption (07/17/2018)    Time  6    Period  Weeks    Status  Achieved    Target Date  08/08/18      PT LONG TERM GOAL #2   Title  Patient will improve R shoulder ER PROM to at least 70 degrees and IR PROM to at least 90 degrees to promote ability to use her R UE for self care as well as promote ability to don and doff clothes when appropriate.     Baseline  R shoulder PROM in scapular plane: 35 degrees ER, 72 degrees IR (06/25/2018); 54 degrees ER, 76 degrees IR (07/17/2018)    Time  6    Period  Weeks    Status  Partially Met    Target Date  08/08/18      PT LONG TERM GOAL #3   Title  Pt will improve her R shoulder FOTO score by at least 20 points as a demonstration of improved function.     Baseline  R shoulder FOTO 4 (06/25/2018)    Time  6    Period  Weeks    Status  On-going    Target Date  08/08/18      PT LONG TERM GOAL #4   Title  Patient will improve R shoulder flexion and scaption AAROM to 130 degrees or more to promote ability to raise her R arm when appropriate.     Baseline  AAROM not yet performed secondary to PROM for first 6 weeks (07/17/2018)    Time  7    Period  Weeks    Status  New  Target Date  09/05/18      PT LONG TERM GOAL #5   Title  Patient will have at least 4/5 R shoulder flexion, scaption, ER and IR strength to promote ability to utilize her R UE to  perform funcitonal tasks when appropriate.     Baseline  Strength not tested to allow for more healing (07/17/2018)    Time  13    Period  Weeks    Status  New    Target Date  10/17/18            Plan - 07/30/18 1656    Clinical Impression Statement  Continued working on ROM to promote ability to raise her R arm when appropriate. Able to achieve 101 degrees R shoulder flexion AAROM in supine. Continued working on STM to R upper trap/rhomboid muscle area to decrease tension.      Rehab Potential  Fair    Clinical Impairments Affecting Rehab Potential  (-) age, chronicity of condition prior to surgery, healing time; (+) motivated    PT Frequency  2x / week    PT Duration  6 weeks    PT Treatment/Interventions  Manual techniques;Neuromuscular re-education;Therapeutic activities;Therapeutic exercise;Patient/family education;Passive range of motion;Dry needling;Aquatic Therapy;Electrical Stimulation;Iontophoresis 62m/ml Dexamethasone;Ultrasound    PT Next Visit Plan  PROM R shoulder, no overhead motions, no overhead lifting for first 6 weeks. Can follow [place] protocol per MCarlynn SpryPA-C    Consulted and Agree with Plan of Care  Patient       Patient will benefit from skilled therapeutic intervention in order to improve the following deficits and impairments:  Pain, Postural dysfunction, Improper body mechanics, Impaired UE functional use, Decreased strength, Decreased range of motion  Visit Diagnosis: Right shoulder pain, unspecified chronicity  Muscle weakness (generalized)     Problem List There are no active problems to display for this patient.   MJoneen BoersPT, DPT   07/30/2018, 8:08 PM  [facility] [address] [place] [address]Phone: [phone]  Fax:  [phone] Name: STIAWANA FORGYMRN: [id number] of Birth: 125-Nov-1947

## 2018-08-01 ENCOUNTER — Ambulatory Visit: Payer: PRIVATE HEALTH INSURANCE

## 2018-08-01 DIAGNOSIS — M6281 Muscle weakness (generalized): Secondary | ICD-10-CM

## 2018-08-01 DIAGNOSIS — M25511 Pain in right shoulder: Secondary | ICD-10-CM

## 2018-08-01 NOTE — Therapy (Signed)
[facility] [address], [place], [address] Phone: [phone]   Fax:  [phone]  Physical Therapy Treatment  Patient Details  Name: Alyssa Baird MRN: [id number] Date of Birth: November 16, 1946 Referring Provider: Kurtis Bushman, MD   Encounter Date: 08/01/2018  PT End of Session - 08/01/18 1335    Visit Number  10    Number of Visits  13    Date for PT Re-Evaluation  08/08/18    Authorization Type  10    Authorization Time Period  of 12 worker's comp    PT Start Time  1335   pt arrived late   PT Stop Time  1415    PT Time Calculation (min)  40 min    Activity Tolerance  Patient tolerated treatment well    Behavior During Therapy  [facility] for tasks assessed/performed       Past Medical History:  Diagnosis Date  . Arthritis   . Asthma    only URI  . Hyperlipidemia   . Hypertension     Past Surgical History:  Procedure Laterality Date  . ABDOMINAL HYSTERECTOMY    . CHOLECYSTECTOMY    . SHOULDER ARTHROSCOPY WITH ROTATOR CUFF REPAIR Right 06/11/2018   Procedure: SHOULDER ARTHROSCOPY WITH ROTATOR CUFF REPAIR;  Surgeon: Lovell Sheehan, MD;  Location: [facility] ORS;  Service: Orthopedics;  Laterality: Right;    There were no vitals filed for this visit.  Subjective Assessment - 08/01/18 1337    Subjective  R shoulder is better. No pain currently     Pertinent History  S/P R rotator cuff surgery on 06/11/2018. Pt was lifting a bag of linnen at work last year (07/01/2017) which resulted in injury. Thought her shoulder would get better on its own but it got worse.  Had PT at [facility] which did not fix it.   Has not had any PT since her surgery. Was told to perform pendulums at home.  Forgot to use her abduction pillow for her sling.  Also feels pain at the top of her R shoulder blade.  Pt is R hand dominant.  No falls within the last 6 months.  No fear of falling.     Patient Stated Goals  Wants to be better able  to use her R arm to take a shower, and don and doff clothes     Currently in Pain?  No/denies    Pain Score  0-No pain    Pain Onset  More than a month ago                               PT Education - 08/01/18 1340    Education Details  ther-ex, HEP    Person(s) Educated  Patient    Methods  Explanation;Demonstration;Tactile cues;Verbal cues;Handout    Comprehension  Returned demonstration;Verbalized understanding        Objective  Per phone conversation with Carlynn Spry PA-C on 06/24/2018: Can use [place] rotator cuff protocol, no overhead motions, no overhead lifting and just do PROM for the first 6 weeks  S/P 7weeks   Therapeutic exercise  Bent over R scapular retraction 10x3 with 5 second holds  Seated with R arm in neutral: submax isometrics (pain-free level of effort) ER and IR 5x5 seconds for 3 sets each way based on [facility] hospital protocol   Supine R shoulder scaption AAROM with PT  10x3  Supine Shoulder Flexion AAROMusing L UE 10x3             116 degrees R shoulder flexion AAROM   Seated Shoulder Scaption Slide at Table Top with forearm in Neutral, AAROM 10x3  Seated shoulder flexion table slide AAROM 10x3  Standing bilateral scapular retraction 10x10 seconds for 2 sets               Improved exercise technique, movement at target joints, use of target muscles after mod verbal, visual, tactile cues.   Able to achieve 116 degrees R shoulder flexion supine AAROM. Stiff end feel. Continued working on gentle ROM to promote ability to raise her R arm up when appropriate.         PT Short Term Goals - 07/17/18 1220      PT SHORT TERM GOAL #1   Title  Patient will be independent with her HEP to promote ability to raise her R arm against gravity and use it for functional tasks when appropriate.     Time  3    Period  Weeks    Status  On-going    Target Date  07/18/18        PT Long  Term Goals - 07/17/18 1221      PT LONG TERM GOAL #1   Title  Patient will improve R shoulder flexion PROM to at least 90 degrees flexion and scaption/abduction to at least 90 degrees to promote ability to raise her R arm, use her R UE to perform functional tasks when appropriate.     Baseline  R shoulder PROM: 75 degrees flexion, 64 degrees abduction (06/25/2018); 90 degrees PROM for both flexion and scaption (07/17/2018)    Time  6    Period  Weeks    Status  Achieved    Target Date  08/08/18      PT LONG TERM GOAL #2   Title  Patient will improve R shoulder ER PROM to at least 70 degrees and IR PROM to at least 90 degrees to promote ability to use her R UE for self care as well as promote ability to don and doff clothes when appropriate.     Baseline  R shoulder PROM in scapular plane: 35 degrees ER, 72 degrees IR (06/25/2018); 54 degrees ER, 76 degrees IR (07/17/2018)    Time  6    Period  Weeks    Status  Partially Met    Target Date  08/08/18      PT LONG TERM GOAL #3   Title  Pt will improve her R shoulder FOTO score by at least 20 points as a demonstration of improved function.     Baseline  R shoulder FOTO 4 (06/25/2018)    Time  6    Period  Weeks    Status  On-going    Target Date  08/08/18      PT LONG TERM GOAL #4   Title  Patient will improve R shoulder flexion and scaption AAROM to 130 degrees or more to promote ability to raise her R arm when appropriate.     Baseline  AAROM not yet performed secondary to PROM for first 6 weeks (07/17/2018)    Time  7    Period  Weeks    Status  New    Target Date  09/05/18      PT LONG TERM GOAL #5   Title  Patient will have at least 4/5 R shoulder  flexion, scaption, ER and IR strength to promote ability to utilize her R UE to perform funcitonal tasks when appropriate.     Baseline  Strength not tested to allow for more healing (07/17/2018)    Time  13    Period  Weeks    Status  New    Target Date  10/17/18            Plan  - 08/01/18 1335    Clinical Impression Statement  Able to achieve 116 degrees R shoulder flexion supine AAROM. Stiff end feel. Continued working on gentle ROM to promote ability to raise her R arm up when appropriate.     Rehab Potential  Fair    Clinical Impairments Affecting Rehab Potential  (-) age, chronicity of condition prior to surgery, healing time; (+) motivated    PT Frequency  2x / week    PT Duration  6 weeks    PT Treatment/Interventions  Manual techniques;Neuromuscular re-education;Therapeutic activities;Therapeutic exercise;Patient/family education;Passive range of motion;Dry needling;Aquatic Therapy;Electrical Stimulation;Iontophoresis '4mg'$ /ml Dexamethasone;Ultrasound    PT Next Visit Plan  PROM R shoulder, no overhead motions, no overhead lifting for first 6 weeks. Can follow [place] rotator cuff protocol per Carlynn Spry PA-C    Consulted and Agree with Plan of Care  Patient       Patient will benefit from skilled therapeutic intervention in order to improve the following deficits and impairments:  Pain, Postural dysfunction, Improper body mechanics, Impaired UE functional use, Decreased strength, Decreased range of motion  Visit Diagnosis: Right shoulder pain, unspecified chronicity  Muscle weakness (generalized)     Problem List There are no active problems to display for this patient.   Joneen Boers PT, DPT   08/01/2018, 8:19 PM  [place] [facility] [address] [place], [address] Phone: [phone]   Fax:  [phone]  Name: Alyssa Baird MRN: [id number] Date of Birth: May 30, 1946

## 2018-08-01 NOTE — Patient Instructions (Signed)
MedbridgeAccess Code: [email or web address]  Seated Shoulder Scaption Slide at Table Top with Forearm in Neutral  10x3 comfortably

## 2018-08-06 ENCOUNTER — Ambulatory Visit: Payer: PRIVATE HEALTH INSURANCE | Attending: Orthopedic Surgery

## 2018-08-06 DIAGNOSIS — M25511 Pain in right shoulder: Secondary | ICD-10-CM | POA: Diagnosis present

## 2018-08-06 DIAGNOSIS — M6281 Muscle weakness (generalized): Secondary | ICD-10-CM | POA: Diagnosis present

## 2018-08-06 NOTE — Therapy (Signed)
[facility] [address] [place], [address] Phone: [phone]   Fax:  [phone]  Physical Therapy Treatment And Progress Report  Patient Details  Name: Alyssa Baird MRN: [id number] Date of Birth: Feb 09, 1946 Referring Provider: Kurtis Bushman, MD   Encounter Date: 08/06/2018  PT End of Session - 08/06/18 1650    Visit Number  11    Number of Visits  25    Date for PT Re-Evaluation  09/19/18    Authorization Type  11    Authorization Time Period  of 12 worker's comp    PT Start Time  6962    PT Stop Time  1739    PT Time Calculation (min)  49 min    Activity Tolerance  Patient tolerated treatment well    Behavior During Therapy  [facility] for tasks assessed/performed       Past Medical History:  Diagnosis Date  . Arthritis   . Asthma    only URI  . Hyperlipidemia   . Hypertension     Past Surgical History:  Procedure Laterality Date  . ABDOMINAL HYSTERECTOMY    . CHOLECYSTECTOMY    . SHOULDER ARTHROSCOPY WITH ROTATOR CUFF REPAIR Right 06/11/2018   Procedure: SHOULDER ARTHROSCOPY WITH ROTATOR CUFF REPAIR;  Surgeon: Lovell Sheehan, MD;  Location: [facility] ORS;  Service: Orthopedics;  Laterality: Right;    There were no vitals filed for this visit.  Subjective Assessment - 08/06/18 1651    Subjective  R shoulder is hurting a little bit today. 8/10 currently (R upper trap area). Throbbing. Hurt this morning after getting up. Does not know if she laid on it the wrong way.  Did not bother her yesterday.   Did good after last session. Just a little sore but not bad.     Pertinent History  S/P R rotator cuff surgery on 06/11/2018. Pt was lifting a bag of linnen at work last year (07/01/2017) which resulted in injury. Thought her shoulder would get better on its own but it got worse.  Had PT at [facility] which did not fix it.   Has not had any PT since her surgery. Was told to perform pendulums at home.  Forgot  to use her abduction pillow for her sling.  Also feels pain at the top of her R shoulder blade.  Pt is R hand dominant.  No falls within the last 6 months.  No fear of falling.     Patient Stated Goals  Wants to be better able to use her R arm to take a shower, and don and doff clothes     Currently in Pain?  Yes    Pain Score  8     Pain Onset  More than a month ago                               PT Education - 08/06/18 1658    Education Details  ther-ex, HEP    Person(s) Educated  Patient    Methods  Explanation;Demonstration;Tactile cues;Verbal cues;Handout    Comprehension  Returned demonstration;Verbalized understanding       Objective  Per phone conversation with Carlynn Spry PA-C on 06/24/2018: Can use [place] rotator cuff protocol, no overhead motions, no overhead lifting and just do PROM for the first 6 weeks  S/P 8weeks  MedbridgeAccess Code: [email or web address]   Therapeutic exercise  Seated  manually resisted R scapular retraction isometrics 10x5 seconds for 3 sets. Slight decrease in symptoms.   Seated chin tucks 10x5 seconds for 2 sets. Slight decrease R shoulder/upper trap discomfort with scapular retraction.   Seated Shoulder Scaption Slide at Table Top with forearm in Neutral, AAROM 10x3  Seated shoulder flexion table slide AAROM 10x3   Supine Shoulder Flexion AAROMusing L UE 10x2  115 degrees shoulder flexion AAROM   Recommended for pt to perform this HEP instead of table slide AAROM for flexion secondary to better form. Pt demonstrated and verbalized understanding.   Supine R shoulder scaption AAROM with PT 10x3  122 degrees scaption AAROM   In scapular plane AAROM ER:53 AAROM IR: 60  Slight decreased IR ROM since last measured.     Improved exercise technique, movement at target joints, use of target muscles after mod verbal, visual, tactile cues.    Manual therapy  Seated STM R upper trap/rhomboid  muscle area (towards proximal insertion)   Pt states R shoulder feels better     Decreased R shoulder/lateral neck pain following treatment to decrease R upper trap/rhomboid muscle tension. Continued working on improving R shoulder flexion and scaption ROM to promote ability to raise her R arm up when appropriate. Able to achieve 122 degrees scaption AAROM and 115 degrees flexion AAROM in supine. Discomfort towards end range which eases with rest. Slight decrease in R shoulder IR ROM compared to when last measured.  Pt making some progress with flexion and scaption ROM. Pt still demonstrates limited R shoulder ROM, weakness, pain, stiffness, and difficulty performing functional tasks and would benefit from continued skilled physical therapy services to address the aforementioned deficits.    PT Short Term Goals - 08/06/18 1749      PT SHORT TERM GOAL #1   Title  Patient will be independent with her HEP to promote ability to raise her R arm against gravity and use it for functional tasks when appropriate.     Time  3    Period  Weeks    Status  On-going    Target Date  08/29/18        PT Long Term Goals - 08/06/18 1750      PT LONG TERM GOAL #1   Title  Patient will improve R shoulder flexion PROM to at least 90 degrees flexion and scaption/abduction to at least 90 degrees to promote ability to raise her R arm, use her R UE to perform functional tasks when appropriate.     Baseline  R shoulder PROM: 75 degrees flexion, 64 degrees abduction (06/25/2018); 90 degrees PROM for both flexion and scaption (07/17/2018)    Time  6    Period  Weeks    Status  Achieved      PT LONG TERM GOAL #2   Title  Patient will improve R shoulder ER PROM to at least 70 degrees and IR PROM to at least 90 degrees to promote ability to use her R UE for self care as well as promote ability to don and doff clothes when appropriate.     Baseline  R shoulder PROM in scapular plane: 35 degrees ER, 72 degrees IR  (06/25/2018); 54 degrees ER, 76 degrees IR (07/17/2018); 53 degrees ER AAROM, 60 degrees IR AAROM (08/06/2018)    Time  6    Period  Weeks    Status  Partially Met    Target Date  09/19/18      PT LONG TERM GOAL #  3   Title  Pt will improve her R shoulder FOTO score by at least 20 points as a demonstration of improved function.     Baseline  R shoulder FOTO 4 (06/25/2018)    Time  6    Period  Weeks    Status  On-going    Target Date  09/19/18      PT LONG TERM GOAL #4   Title  Patient will improve R shoulder flexion and scaption AAROM to 130 degrees or more to promote ability to raise her R arm when appropriate.     Baseline  AAROM not yet performed secondary to PROM for first 6 weeks (07/17/2018); supine AAROM:  115 degrees flexion, 122 degrees scaption (08/06/2018)     Time  6    Period  Weeks    Status  On-going    Target Date  09/19/18      PT LONG TERM GOAL #5   Title  Patient will have at least 4/5 R shoulder flexion, scaption, ER and IR strength to promote ability to utilize her R UE to perform funcitonal tasks when appropriate.     Baseline  Strength not tested to allow for more healing (07/17/2018); Not yet tested (08/06/2018)    Time  6    Period  Weeks    Status  On-going    Target Date  09/19/18            Plan - 08/06/18 1658    Clinical Impression Statement  Decreased R shoulder/lateral neck pain following treatment to decrease R upper trap/rhomboid muscle tension. Continued working on improving R shoulder flexion and scaption ROM to promote ability to raise her R arm up when appropriate. Able to achieve 122 degrees scaption AAROM and 115 degrees flexion AAROM in supine. Discomfort towards end range which eases with rest. Slight decrease in R shoulder IR ROM compared to when last measured.  Pt making some progress with flexion and scaption ROM. Pt still demonstrates limited R shoulder ROM, weakness, pain, stiffness, and difficulty performing functional tasks and would benefit  from continued skilled physical therapy services to address the aforementioned deficits.     History and Personal Factors relevant to plan of care:  Chronicity of condition prior to surgery, pain, weakness, TTP, difficulty reaching, using her R UE for self care, donning and doffing clothes    Clinical Presentation  Stable    Clinical Presentation due to:  Improved ROM overall since eval    Clinical Decision Making  Low    Rehab Potential  Fair    Clinical Impairments Affecting Rehab Potential  (-) age, chronicity of condition prior to surgery, healing time; (+) motivated    PT Frequency  2x / week    PT Duration  6 weeks    PT Treatment/Interventions  Manual techniques;Neuromuscular re-education;Therapeutic activities;Therapeutic exercise;Patient/family education;Passive range of motion;Dry needling;Aquatic Therapy;Electrical Stimulation;Iontophoresis 58m/ml Dexamethasone;Ultrasound    PT Next Visit Plan  PROM, AAROM R shoulder, no overhead motions, no overhead lifting for first 6 weeks. Can follow [place] cuff protocol per MCarlynn SpryPA-C    Consulted and Agree with Plan of Care  Patient       Patient will benefit from skilled therapeutic intervention in order to improve the following deficits and impairments:  Pain, Postural dysfunction, Improper body mechanics, Impaired UE functional use, Decreased strength, Decreased range of motion  Visit Diagnosis: Right shoulder pain, unspecified chronicity - Plan: PT plan of care cert/re-cert  Muscle weakness (  generalized) - Plan: PT plan of care cert/re-cert     Problem List There are no active problems to display for this patient.  Thank you for your referral.  Joneen Boers PT, DPT   08/06/2018, 6:14 PM  [facility] [address], [place], [address] Phone: [phone]   Fax:  [phone]  Name: Alyssa Baird MRN: [id number] Date of  Birth: Aug 04, 1946

## 2018-08-06 NOTE — Patient Instructions (Addendum)
MedbridgeAccess Code: [email or web address]  Neck retraction 10x3 with 5 second holds     Supine Shoulder Flexion AAROMusing L UE 10x2    Recommended for pt to perform this HEP instead of table slide AAROM for flexion secondary to better form. Pt demonstrated and verbalized understanding.

## 2018-08-08 ENCOUNTER — Ambulatory Visit: Payer: PRIVATE HEALTH INSURANCE

## 2018-08-08 DIAGNOSIS — M25511 Pain in right shoulder: Secondary | ICD-10-CM

## 2018-08-08 DIAGNOSIS — M6281 Muscle weakness (generalized): Secondary | ICD-10-CM

## 2018-08-08 NOTE — Therapy (Signed)
[facility] [address], [place], [address] Phone: [phone]   Fax:  [phone]  Physical Therapy Treatment  Patient Details  Name: Alyssa Baird MRN: [id number] Date of Birth: 17-Jan-1946 Referring Provider: Kurtis Bushman, MD   Encounter Date: 08/08/2018  PT End of Session - 08/08/18 1717    Visit Number  12    Number of Visits  25    Date for PT Re-Evaluation  09/19/18    Authorization Type  12    Authorization Time Period  of 12 worker's comp    PT Start Time  1345    PT Stop Time  1430    PT Time Calculation (min)  45 min    Activity Tolerance  Patient tolerated treatment well    Behavior During Therapy  [facility] for tasks assessed/performed       Past Medical History:  Diagnosis Date  . Arthritis   . Asthma    only URI  . Hyperlipidemia   . Hypertension     Past Surgical History:  Procedure Laterality Date  . ABDOMINAL HYSTERECTOMY    . CHOLECYSTECTOMY    . SHOULDER ARTHROSCOPY WITH ROTATOR CUFF REPAIR Right 06/11/2018   Procedure: SHOULDER ARTHROSCOPY WITH ROTATOR CUFF REPAIR;  Surgeon: Lovell Sheehan, MD;  Location: [facility] ORS;  Service: Orthopedics;  Laterality: Right;    There were no vitals filed for this visit.  Subjective Assessment - 08/08/18 1715    Subjective  Pt reports some increase in R shoulder pain today upon arrival. She reports compliance with HEP. No specific questions or concerns at this time.     Pertinent History  S/P R rotator cuff surgery on 06/11/2018. Pt was lifting a bag of linnen at work last year (07/01/2017) which resulted in injury. Thought her shoulder would get better on its own but it got worse.  Had PT at [facility] which did not fix it.   Has not had any PT since her surgery. Was told to perform pendulums at home.  Forgot to use her abduction pillow for her sling.  Also feels pain at the top of her R shoulder blade.  Pt is R hand dominant.  No falls within the  last 6 months.  No fear of falling.     Patient Stated Goals  Wants to be better able to use her R arm to take a shower, and don and doff clothes     Currently in Pain?  Yes    Pain Score  6     Pain Location  Shoulder    Pain Orientation  Right    Pain Descriptors / Indicators  Aching    Pain Type  Chronic pain;Surgical pain    Pain Onset  More than a month ago    Pain Frequency  Constant        TREATMENT  Therapeutic exercise Supine R shoulder flexion AAROMusing L UE 2 x 10; Supine R shoulder ER AAROM with cane using LUE 2 x 10, (attempted scaption but pt unable to perform secondary to pain; Supine R shoulder serratus punch with manual resistance 5s hold x 10;  Manual Therapy  STM to R anterior, lateral, and posterior deltoid as well as biceps. Therapist performed very gently however pt with notable tenderness to palpation x 5 minutes; R shoulder grade 1, AP mobs at neutral, pt unable to tolerate so discontinued; R shoulder grade 1, inferior mobs at 90 abduction, 30s/bout x  2 bouts;  Estim Pt reports increase in pain today following STM so utilized IFC to R shoulder at pt tolerated intensity. Increased halfway through treatment. Ended at 14.5V. Skin assessed following intervention without signs of superficial irritation. Pt reports decrease in pain at end of session but does not rate.                          PT Education - 08/08/18 1717    Education Details  exercise form/technique    Person(s) Educated  Patient    Methods  Explanation    Comprehension  Verbalized understanding       PT Short Term Goals - 08/06/18 1749      PT SHORT TERM GOAL #1   Title  Patient will be independent with her HEP to promote ability to raise her R arm against gravity and use it for functional tasks when appropriate.     Time  3    Period  Weeks    Status  On-going    Target Date  08/29/18        PT Long Term Goals - 08/06/18 1750      PT LONG TERM GOAL #1    Title  Patient will improve R shoulder flexion PROM to at least 90 degrees flexion and scaption/abduction to at least 90 degrees to promote ability to raise her R arm, use her R UE to perform functional tasks when appropriate.     Baseline  R shoulder PROM: 75 degrees flexion, 64 degrees abduction (06/25/2018); 90 degrees PROM for both flexion and scaption (07/17/2018)    Time  6    Period  Weeks    Status  Achieved      PT LONG TERM GOAL #2   Title  Patient will improve R shoulder ER PROM to at least 70 degrees and IR PROM to at least 90 degrees to promote ability to use her R UE for self care as well as promote ability to don and doff clothes when appropriate.     Baseline  R shoulder PROM in scapular plane: 35 degrees ER, 72 degrees IR (06/25/2018); 54 degrees ER, 76 degrees IR (07/17/2018); 53 degrees ER AAROM, 60 degrees IR AAROM (08/06/2018)    Time  6    Period  Weeks    Status  Partially Met    Target Date  09/19/18      PT LONG TERM GOAL #3   Title  Pt will improve her R shoulder FOTO score by at least 20 points as a demonstration of improved function.     Baseline  R shoulder FOTO 4 (06/25/2018)    Time  6    Period  Weeks    Status  On-going    Target Date  09/19/18      PT LONG TERM GOAL #4   Title  Patient will improve R shoulder flexion and scaption AAROM to 130 degrees or more to promote ability to raise her R arm when appropriate.     Baseline  AAROM not yet performed secondary to PROM for first 6 weeks (07/17/2018); supine AAROM:  115 degrees flexion, 122 degrees scaption (08/06/2018)     Time  6    Period  Weeks    Status  On-going    Target Date  09/19/18      PT LONG TERM GOAL #5   Title  Patient will have at least 4/5 R shoulder flexion, scaption,  ER and IR strength to promote ability to utilize her R UE to perform funcitonal tasks when appropriate.     Baseline  Strength not tested to allow for more healing (07/17/2018); Not yet tested (08/06/2018)    Time  6    Period   Weeks    Status  On-going    Target Date  09/19/18            Plan - 08/08/18 1430    Clinical Impression Statement  Pt is somewhat limited during session today secondary to pain. She reports increase in pain following STM to R shoulder. IFC utilized to hlep decrease pain and pt reports improvement at end of session. Continued working on gentle ROM to promote ability to raise her R arm up when appropriate. Pt encouraged to continue HEP and follow-up as scheduled.     Rehab Potential  Fair    Clinical Impairments Affecting Rehab Potential  (-) age, chronicity of condition prior to surgery, healing time; (+) motivated    PT Frequency  2x / week    PT Duration  6 weeks    PT Treatment/Interventions  Manual techniques;Neuromuscular re-education;Therapeutic activities;Therapeutic exercise;Patient/family education;Passive range of motion;Dry needling;Aquatic Therapy;Electrical Stimulation;Iontophoresis '4mg'$ /ml Dexamethasone;Ultrasound    PT Next Visit Plan  PROM, AAROM R shoulder, no overhead motions, no overhead lifting for first 6 weeks. Can follow [place] rotator cuff protocol per Carlynn Spry PA-C    Consulted and Agree with Plan of Care  Patient       Patient will benefit from skilled therapeutic intervention in order to improve the following deficits and impairments:  Pain, Postural dysfunction, Improper body mechanics, Impaired UE functional use, Decreased strength, Decreased range of motion  Visit Diagnosis: Right shoulder pain, unspecified chronicity  Muscle weakness (generalized)     Problem List There are no active problems to display for this patient.  Lyndel Safe Lowen Mansouri PT, DPT, GCS  Nathasha Fiorillo 08/08/2018, 5:30 PM  [facility] [address] [place], [address] Phone: [phone]   Fax:  [phone]  Name: LAYLA KESLING MRN: [id number] Date of Birth: 25-Nov-1946

## 2018-08-12 ENCOUNTER — Ambulatory Visit: Payer: PRIVATE HEALTH INSURANCE

## 2018-08-15 ENCOUNTER — Ambulatory Visit: Payer: PRIVATE HEALTH INSURANCE

## 2018-08-15 DIAGNOSIS — M25511 Pain in right shoulder: Secondary | ICD-10-CM | POA: Diagnosis not present

## 2018-08-15 DIAGNOSIS — M6281 Muscle weakness (generalized): Secondary | ICD-10-CM

## 2018-08-15 NOTE — Therapy (Signed)
[place] [facility] [address] [place], [address] Phone: [phone]   Fax:  [phone]  Physical Therapy Treatment  Patient Details  Name: Alyssa FURGERSON MRN: [id number] Date of Birth: [birth year] Referring Provider: Kurtis Bushman, MD   Encounter Date: 08/15/2018  PT End of Session - 08/15/18 1518    Visit Number  13    Number of Visits  25    Date for PT Re-Evaluation  09/19/18    Authorization Type  1    Authorization Time Period  of 12 worker's comp    PT Start Time  1515    PT Stop Time  1545    PT Time Calculation (min)  30 min    Activity Tolerance  Patient tolerated treatment well    Behavior During Therapy  [facility] for tasks assessed/performed       Past Medical History:  Diagnosis Date  . Arthritis   . Asthma    only URI  . Hyperlipidemia   . Hypertension     Past Surgical History:  Procedure Laterality Date  . ABDOMINAL HYSTERECTOMY    . CHOLECYSTECTOMY    . SHOULDER ARTHROSCOPY WITH ROTATOR CUFF REPAIR Right 06/11/2018   Procedure: SHOULDER ARTHROSCOPY WITH ROTATOR CUFF REPAIR;  Surgeon: Lovell Sheehan, MD;  Location: [facility] ORS;  Service: Orthopedics;  Laterality: Right;    There were no vitals filed for this visit.  Subjective Assessment - 08/15/18 1517    Subjective  Pt reports that she is doing well today. She denies pain upon arrival. Compliant with HEP. No specific questions or concerns at this time.     Pertinent History  S/P R rotator cuff surgery on 06/11/2018. Pt was lifting a bag of linnen at work last year (07/01/2017) which resulted in injury. Thought her shoulder would get better on its own but it got worse.  Had PT at [facility] which did not fix it.   Has not had any PT since her surgery. Was told to perform pendulums at home.  Forgot to use her abduction pillow for her sling.  Also feels pain at the top of her R shoulder blade.  Pt is R hand dominant.  No falls within the last 6  months.  No fear of falling.     Patient Stated Goals  Wants to be better able to use her R arm to take a shower, and don and doff clothes     Currently in Pain?  No/denies        TREATMENT  Therapeutic exercise Supine R shoulder flexion AAROMusing L UE 2 x 10; Supine R shoulder AAROM scaption with therapist assist 2 x 10; Supine R shoulder serratus punch with manual resistance 5s hold x 10; Seated with R arm in neutral: submax isometrics (pain-free level of effort) ER and IR 5s hold x 10 for2sets each waybased on [facility] protocol; Seated shoulder flexion table slide AAROM with 3s hold at end range 2 x 10; Seated shoulder scaption table slide AAROM with 3s hold at end range 2 x 10 Cold pack applied to R shoulder at end of session x 5 minutes (unbilled);                       PT Education - 08/15/18 1518    Education Details  exercise form/technique    Person(s) Educated  Patient    Methods  Explanation    Comprehension  Verbalized understanding       PT Short Term Goals - 08/06/18 1749      PT SHORT TERM GOAL #1   Title  Patient will be independent with her HEP to promote ability to raise her R arm against gravity and use it for functional tasks when appropriate.     Time  3    Period  Weeks    Status  On-going    Target Date  08/29/18        PT Long Term Goals - 08/06/18 1750      PT LONG TERM GOAL #1   Title  Patient will improve R shoulder flexion PROM to at least 90 degrees flexion and scaption/abduction to at least 90 degrees to promote ability to raise her R arm, use her R UE to perform functional tasks when appropriate.     Baseline  R shoulder PROM: 75 degrees flexion, 64 degrees abduction (06/25/2018); 90 degrees PROM for both flexion and scaption (07/17/2018)    Time  6    Period  Weeks    Status  Achieved      PT LONG TERM GOAL #2   Title  Patient will improve R shoulder ER PROM to at least 70 degrees and IR PROM to at  least 90 degrees to promote ability to use her R UE for self care as well as promote ability to don and doff clothes when appropriate.     Baseline  R shoulder PROM in scapular plane: 35 degrees ER, 72 degrees IR (06/25/2018); 54 degrees ER, 76 degrees IR (07/17/2018); 53 degrees ER AAROM, 60 degrees IR AAROM (08/06/2018)    Time  6    Period  Weeks    Status  Partially Met    Target Date  09/19/18      PT LONG TERM GOAL #3   Title  Pt will improve her R shoulder FOTO score by at least 20 points as a demonstration of improved function.     Baseline  R shoulder FOTO 4 (06/25/2018)    Time  6    Period  Weeks    Status  On-going    Target Date  09/19/18      PT LONG TERM GOAL #4   Title  Patient will improve R shoulder flexion and scaption AAROM to 130 degrees or more to promote ability to raise her R arm when appropriate.     Baseline  AAROM not yet performed secondary to PROM for first 6 weeks (07/17/2018); supine AAROM:  115 degrees flexion, 122 degrees scaption (08/06/2018)     Time  6    Period  Weeks    Status  On-going    Target Date  09/19/18      PT LONG TERM GOAL #5   Title  Patient will have at least 4/5 R shoulder flexion, scaption, ER and IR strength to promote ability to utilize her R UE to perform funcitonal tasks when appropriate.     Baseline  Strength not tested to allow for more healing (07/17/2018); Not yet tested (08/06/2018)    Time  6    Period  Weeks    Status  On-going    Target Date  09/19/18            Plan - 08/15/18 1521    Clinical Impression Statement  Pt is late for appointment so session is somewhat limited. No increase in pain during exercise today. Avoided manual therapy due to increase  in pain during last session. Continue working on gentle ROM to promote ability to raise her R arm up when appropriate. Pt encouraged to continue HEP and follow-up as scheduled.    Rehab Potential  Fair    Clinical Impairments Affecting Rehab Potential  (-) age, chronicity  of condition prior to surgery, healing time; (+) motivated    PT Frequency  2x / week    PT Duration  6 weeks    PT Treatment/Interventions  Manual techniques;Neuromuscular re-education;Therapeutic activities;Therapeutic exercise;Patient/family education;Passive range of motion;Dry needling;Aquatic Therapy;Electrical Stimulation;Iontophoresis '4mg'$ /ml Dexamethasone;Ultrasound    PT Next Visit Plan  PROM, AAROM R shoulder, no overhead motions, no overhead lifting for first 6 weeks. Can follow [place] rotator cuff protocol per Carlynn Spry PA-C    Consulted and Agree with Plan of Care  Patient       Patient will benefit from skilled therapeutic intervention in order to improve the following deficits and impairments:  Pain, Postural dysfunction, Improper body mechanics, Impaired UE functional use, Decreased strength, Decreased range of motion  Visit Diagnosis: Right shoulder pain, unspecified chronicity  Muscle weakness (generalized)     Problem List There are no active problems to display for this patient.  Lyndel Safe Huprich PT, DPT, GCS  Huprich,Jason 08/15/2018, 3:40 PM  [place] [facility] [address] [place], [address] Phone: [phone]   Fax:  [phone]  Name: MACKIE GOON MRN: [id number] Date of Birth: Dec 28, 1945

## 2018-08-19 ENCOUNTER — Ambulatory Visit: Payer: PRIVATE HEALTH INSURANCE

## 2018-08-19 DIAGNOSIS — M6281 Muscle weakness (generalized): Secondary | ICD-10-CM

## 2018-08-19 DIAGNOSIS — M25511 Pain in right shoulder: Secondary | ICD-10-CM | POA: Diagnosis not present

## 2018-08-19 NOTE — Therapy (Signed)
[place] [facility] [address] [place], [address] Phone: [phone]   Fax:  [phone]  Physical Therapy Treatment  Patient Details  Name: Alyssa Baird MRN: [id number] Date of Birth: 04/30/1946 Referring Provider: Kurtis Bushman, MD   Encounter Date: 08/19/2018  PT End of Session - 08/19/18 1358    Visit Number  14    Number of Visits  25    Date for PT Re-Evaluation  09/19/18    Authorization Type  2    Authorization Time Period  of 12 worker's comp    PT Start Time  1355    PT Stop Time  1430    PT Time Calculation (min)  35 min    Activity Tolerance  Patient tolerated treatment well    Behavior During Therapy  [facility] for tasks assessed/performed       Past Medical History:  Diagnosis Date  . Arthritis   . Asthma    only URI  . Hyperlipidemia   . Hypertension     Past Surgical History:  Procedure Laterality Date  . ABDOMINAL HYSTERECTOMY    . CHOLECYSTECTOMY    . SHOULDER ARTHROSCOPY WITH ROTATOR CUFF REPAIR Right 06/11/2018   Procedure: SHOULDER ARTHROSCOPY WITH ROTATOR CUFF REPAIR;  Surgeon: Lovell Sheehan, MD;  Location: [facility] ORS;  Service: Orthopedics;  Laterality: Right;    There were no vitals filed for this visit.  Subjective Assessment - 08/19/18 1358    Subjective  Pt reports that she is doing well today. She reports a 6/10 pain upon arrival. No increase in pain after last session. Compliant with HEP. No specific questions or concerns at this time.     Pertinent History  S/P R rotator cuff surgery on 06/11/2018. Pt was lifting a bag of linnen at work last year (07/01/2017) which resulted in injury. Thought her shoulder would get better on its own but it got worse.  Had PT at [facility] which did not fix it.   Has not had any PT since her surgery. Was told to perform pendulums at home.  Forgot to use her abduction pillow for her sling.  Also feels pain at the top of her R shoulder blade.  Pt  is R hand dominant.  No falls within the last 6 months.  No fear of falling.     Patient Stated Goals  Wants to be better able to use her R arm to take a shower, and don and doff clothes     Currently in Pain?  Yes    Pain Score  6     Pain Location  Shoulder    Pain Orientation  Right    Pain Descriptors / Indicators  Aching    Pain Type  Chronic pain    Pain Onset  More than a month ago         TREATMENT  Therapeutic exercise SupineR shoulderflexion AAROMusing L UE2 x 10; Supine R shoulder PROM for flexion, scaption, and ER with end range holds x 2 each direction; Supine R shoulder cross body stretch 20s x 2; R shoulder rhythmic stabilization at 90 flexion with very gentle perturbations provided at wrist 30s x 2; Supine R shoulder serratus punch with manual resistance 3s hold 2 x 10, no pain reported; Seated with R arm in neutral: submax isometrics (pain-free level of effort) ER and IR 5s hold x 5 for2sets each waybased on [facility] hospital protocol; L sidelying R shoulder  ER with towel roll under R elbow and no resistance 2 x 5; Seated shoulder flexion table slide AAROM with 3s hold at end range x 10; Seated shoulder scaption table slide AAROM with 3s hold at end range x 10; Cold pack applied to R shoulder at end of session x 5 minutes (unbilled);  Pt educated throughout session about proper posture and technique with exercises. Improved exercise technique, movement at target joints, use of target muscles after min to mod verbal, visual, tactile cues.    Pt is late again for appointment so session is somewhat limited. No increase in pain during exercise today. Avoided manual therapy again due to increase in pain two sessions ago. Continue working on gentle ROM to promote ability to raise her R arm up when appropriate per protocol. Pt encouraged to continue HEP and follow-up as scheduled                   PT Short Term Goals - 08/06/18 1749      PT  SHORT TERM GOAL #1   Title  Patient will be independent with her HEP to promote ability to raise her R arm against gravity and use it for functional tasks when appropriate.     Time  3    Period  Weeks    Status  On-going    Target Date  08/29/18        PT Long Term Goals - 08/06/18 1750      PT LONG TERM GOAL #1   Title  Patient will improve R shoulder flexion PROM to at least 90 degrees flexion and scaption/abduction to at least 90 degrees to promote ability to raise her R arm, use her R UE to perform functional tasks when appropriate.     Baseline  R shoulder PROM: 75 degrees flexion, 64 degrees abduction (06/25/2018); 90 degrees PROM for both flexion and scaption (07/17/2018)    Time  6    Period  Weeks    Status  Achieved      PT LONG TERM GOAL #2   Title  Patient will improve R shoulder ER PROM to at least 70 degrees and IR PROM to at least 90 degrees to promote ability to use her R UE for self care as well as promote ability to don and doff clothes when appropriate.     Baseline  R shoulder PROM in scapular plane: 35 degrees ER, 72 degrees IR (06/25/2018); 54 degrees ER, 76 degrees IR (07/17/2018); 53 degrees ER AAROM, 60 degrees IR AAROM (08/06/2018)    Time  6    Period  Weeks    Status  Partially Met    Target Date  09/19/18      PT LONG TERM GOAL #3   Title  Pt will improve her R shoulder FOTO score by at least 20 points as a demonstration of improved function.     Baseline  R shoulder FOTO 4 (06/25/2018)    Time  6    Period  Weeks    Status  On-going    Target Date  09/19/18      PT LONG TERM GOAL #4   Title  Patient will improve R shoulder flexion and scaption AAROM to 130 degrees or more to promote ability to raise her R arm when appropriate.     Baseline  AAROM not yet performed secondary to PROM for first 6 weeks (07/17/2018); supine AAROM:  115 degrees flexion, 122 degrees scaption (08/06/2018)  Time  6    Period  Weeks    Status  On-going    Target Date  09/19/18       PT LONG TERM GOAL #5   Title  Patient will have at least 4/5 R shoulder flexion, scaption, ER and IR strength to promote ability to utilize her R UE to perform funcitonal tasks when appropriate.     Baseline  Strength not tested to allow for more healing (07/17/2018); Not yet tested (08/06/2018)    Time  6    Period  Weeks    Status  On-going    Target Date  09/19/18            Plan - 08/19/18 1359    Clinical Impression Statement  Pt is late again for appointment so session is somewhat limited. No increase in pain during exercise today. Avoided manual therapy again due to increase in pain two sessions ago. Continue working on gentle ROM to promote ability to raise her R arm up when appropriate per protocol. Pt encouraged to continue HEP and follow-up as scheduled    Rehab Potential  Fair    Clinical Impairments Affecting Rehab Potential  (-) age, chronicity of condition prior to surgery, healing time; (+) motivated    PT Frequency  2x / week    PT Duration  6 weeks    PT Treatment/Interventions  Manual techniques;Neuromuscular re-education;Therapeutic activities;Therapeutic exercise;Patient/family education;Passive range of motion;Dry needling;Aquatic Therapy;Electrical Stimulation;Iontophoresis '4mg'$ /ml Dexamethasone;Ultrasound    PT Next Visit Plan  PROM, AAROM R shoulder, no overhead motions, no overhead lifting for first 6 weeks. Can follow [place] rotator cuff protocol per Carlynn Spry PA-C    Consulted and Agree with Plan of Care  Patient       Patient will benefit from skilled therapeutic intervention in order to improve the following deficits and impairments:  Pain, Postural dysfunction, Improper body mechanics, Impaired UE functional use, Decreased strength, Decreased range of motion  Visit Diagnosis: Right shoulder pain, unspecified chronicity  Muscle weakness (generalized)     Problem List There are no active problems to display for this  patient.  Alyssa Baird PT, DPT, GCS  Baird,Alyssa Baird 08/20/2018, 1:39 PM  [place] [facility] [address], [place], [address] Phone: [phone]   Fax:  [phone]  Name: Alyssa Baird MRN: [id number] Date of Birth: [birth year]

## 2018-08-22 ENCOUNTER — Ambulatory Visit: Payer: PRIVATE HEALTH INSURANCE

## 2018-08-22 DIAGNOSIS — M6281 Muscle weakness (generalized): Secondary | ICD-10-CM

## 2018-08-22 DIAGNOSIS — M25511 Pain in right shoulder: Secondary | ICD-10-CM | POA: Diagnosis not present

## 2018-08-22 NOTE — Therapy (Signed)
[place] [facility] [address] [place], [address] Phone: [phone]   Fax:  [phone]  Physical Therapy Treatment  Patient Details  Name: Alyssa Baird MRN: [id number] Date of Birth: [birth year] Referring Provider: Kurtis Bushman, MD   Encounter Date: 08/22/2018  PT End of Session - 08/22/18 1305    Visit Number  15    Number of Visits  25    Date for PT Re-Evaluation  09/19/18    Authorization Type  3    Authorization Time Period  of 12 worker's comp    PT Start Time  1306    PT Stop Time  1358    PT Time Calculation (min)  52 min    Activity Tolerance  Patient tolerated treatment well    Behavior During Therapy  [facility] for tasks assessed/performed       Past Medical History:  Diagnosis Date  . Arthritis   . Asthma    only URI  . Hyperlipidemia   . Hypertension     Past Surgical History:  Procedure Laterality Date  . ABDOMINAL HYSTERECTOMY    . CHOLECYSTECTOMY    . SHOULDER ARTHROSCOPY WITH ROTATOR CUFF REPAIR Right 06/11/2018   Procedure: SHOULDER ARTHROSCOPY WITH ROTATOR CUFF REPAIR;  Surgeon: Lovell Sheehan, MD;  Location: [facility] ORS;  Service: Orthopedics;  Laterality: Right;    There were no vitals filed for this visit.  Subjective Assessment - 08/22/18 1307    Subjective  R shoulder still has a little pain. Might be from the way she lays on it. Sometimes she lays on her R side. 6/10 currently. Was pretty good after last session.     Pertinent History  S/P R rotator cuff surgery on 06/11/2018. Pt was lifting a bag of linnen at work last year (07/01/2017) which resulted in injury. Thought her shoulder would get better on its own but it got worse.  Had PT at [facility] which did not fix it.   Has not had any PT since her surgery. Was told to perform pendulums at home.  Forgot to use her abduction pillow for her sling.  Also feels pain at the top of her R shoulder blade.  Pt is R hand dominant.  No  falls within the last 6 months.  No fear of falling.     Patient Stated Goals  Wants to be better able to use her R arm to take a shower, and don and doff clothes     Currently in Pain?  Yes    Pain Score  6     Pain Onset  More than a month ago                               PT Education - 08/22/18 1319    Education Details  ther-ex    [employer]) Educated  Patient    Methods  Explanation;Demonstration;Tactile cues;Verbal cues    Comprehension  Returned demonstration;Verbalized understanding        Objectives  Pt currently 10 weeks post op   Therapeutic exercise  SupineR shoulderflexion AAROMusing L UE2 x 10;  116 supine R shoulder flexion AAROM  Supine R shoulder scaption AAROM with rod 10x2  125 degrees scaption AAROM  Supine R shoulder ER AAROM with rod in scapular plane 10x2   Supine R shoulder in 90 degrees flexion: rhythmic stabilization (gentle) 10x5 seconds for 2 sets  S/L R shoulder ER 5x3. No pain.   Seated with R arm in neutral: submax isometrics (pain-free level of effort) ER and IR5s hold x 24fr3sets each way  Manually resisted scapular retraction targeting the lower trap muscles 10x5 seconds for 3 sets  Reclined:    L shoulder AAROM flexion 5x3 using L UE  L shoulder scaption AAROM with PT (difficulty with rod assist)  5x   Improved exercise technique, movement at target joints, use of target muscles after min to mod verbal, visual, tactile cues.   Ice to R shoulder x 10 min at end of session    Continued working on A/AROM and submax isometrics per protocol to promote ability to raise her R UE and use it for functional tasks when appropriate. Able to achieve 125 degrees supine scaption AAROM and 116 degrees supine flexion AAROM.      PT Short Term Goals - 08/06/18 1749      PT SHORT TERM GOAL #1   Title  Patient will be independent with her HEP to promote ability to raise her R arm against gravity and use it  for functional tasks when appropriate.     Time  3    Period  Weeks    Status  On-going    Target Date  08/29/18        PT Long Term Goals - 08/06/18 1750      PT LONG TERM GOAL #1   Title  Patient will improve R shoulder flexion PROM to at least 90 degrees flexion and scaption/abduction to at least 90 degrees to promote ability to raise her R arm, use her R UE to perform functional tasks when appropriate.     Baseline  R shoulder PROM: 75 degrees flexion, 64 degrees abduction (06/25/2018); 90 degrees PROM for both flexion and scaption (07/17/2018)    Time  6    Period  Weeks    Status  Achieved      PT LONG TERM GOAL #2   Title  Patient will improve R shoulder ER PROM to at least 70 degrees and IR PROM to at least 90 degrees to promote ability to use her R UE for self care as well as promote ability to don and doff clothes when appropriate.     Baseline  R shoulder PROM in scapular plane: 35 degrees ER, 72 degrees IR (06/25/2018); 54 degrees ER, 76 degrees IR (07/17/2018); 53 degrees ER AAROM, 60 degrees IR AAROM (08/06/2018)    Time  6    Period  Weeks    Status  Partially Met    Target Date  09/19/18      PT LONG TERM GOAL #3   Title  Pt will improve her R shoulder FOTO score by at least 20 points as a demonstration of improved function.     Baseline  R shoulder FOTO 4 (06/25/2018)    Time  6    Period  Weeks    Status  On-going    Target Date  09/19/18      PT LONG TERM GOAL #4   Title  Patient will improve R shoulder flexion and scaption AAROM to 130 degrees or more to promote ability to raise her R arm when appropriate.     Baseline  AAROM not yet performed secondary to PROM for first 6 weeks (07/17/2018); supine AAROM:  115 degrees flexion, 122 degrees scaption (08/06/2018)     Time  6    Period  Weeks  Status  On-going    Target Date  09/19/18      PT LONG TERM GOAL #5   Title  Patient will have at least 4/5 R shoulder flexion, scaption, ER and IR strength to promote  ability to utilize her R UE to perform funcitonal tasks when appropriate.     Baseline  Strength not tested to allow for more healing (07/17/2018); Not yet tested (08/06/2018)    Time  6    Period  Weeks    Status  On-going    Target Date  09/19/18            Plan - 08/22/18 1319    Clinical Impression Statement  Continued working on A/AROM and submax isometrics per protocol to promote ability to raise her R UE and use it for functional tasks when appropriate. Able to achieve 125 degrees supine scaption AAROM and 116 degrees supine flexion AAROM.     Rehab Potential  Fair    Clinical Impairments Affecting Rehab Potential  (-) age, chronicity of condition prior to surgery, healing time; (+) motivated    PT Frequency  2x / week    PT Duration  6 weeks    PT Treatment/Interventions  Manual techniques;Neuromuscular re-education;Therapeutic activities;Therapeutic exercise;Patient/family education;Passive range of motion;Dry needling;Aquatic Therapy;Electrical Stimulation;Iontophoresis 62m/ml Dexamethasone;Ultrasound    PT Next Visit Plan  PROM, AAROM R shoulder, no overhead motions, no overhead lifting for first 6 weeks. [place] per MCarlynn SpryPA-C    Consulted and Agree with Plan of Care  Patient       Patient will benefit from skilled therapeutic intervention in order to improve the following deficits and impairments:  Pain, Postural dysfunction, Improper body mechanics, Impaired UE functional use, Decreased strength, Decreased range of motion  Visit Diagnosis: Right shoulder pain, unspecified chronicity  Muscle weakness (generalized)     Problem List There are no active problems to display for this patient.   MJoneen BoersPT, DPT   08/22/2018, 6:41 PM  [facility] [address] [place] [address]Phone: [phone]  Fax:  [phone] Name: Alyssa BONAPARTEMRN: [id number] of Birth: [birth year]

## 2018-08-26 ENCOUNTER — Ambulatory Visit: Payer: PRIVATE HEALTH INSURANCE

## 2018-08-26 DIAGNOSIS — M25511 Pain in right shoulder: Secondary | ICD-10-CM

## 2018-08-26 DIAGNOSIS — M6281 Muscle weakness (generalized): Secondary | ICD-10-CM

## 2018-08-26 NOTE — Therapy (Signed)
[facility] [address] [place], [place], [address] Phone: [phone]   Fax:  [phone]  Physical Therapy Treatment  Patient Details  Name: Alyssa Baird MRN: [id number] Date of Birth: 10/21/1946 Referring Provider: James Bowers, MD   Encounter Date: 08/26/2018  PT End of Session - 08/26/18 1351    Visit Number  16    Number of Visits  25    Date for PT Re-Evaluation  09/19/18    Authorization Type  4    Authorization Time Period  of 12 worker's comp    PT Start Time  1351    PT Stop Time  1432    PT Time Calculation (min)  41 min    Activity Tolerance  Patient tolerated treatment well    Behavior During Therapy  WFL for tasks assessed/performed       Past Medical History:  Diagnosis Date  . Arthritis   . Asthma    only URI  . Hyperlipidemia   . Hypertension     Past Surgical History:  Procedure Laterality Date  . ABDOMINAL HYSTERECTOMY    . CHOLECYSTECTOMY    . SHOULDER ARTHROSCOPY WITH ROTATOR CUFF REPAIR Right 06/11/2018   Procedure: SHOULDER ARTHROSCOPY WITH ROTATOR CUFF REPAIR;  Surgeon: Bowers, James R, MD;  Location: [facility] ORS;  Service: Orthopedics;  Laterality: Right;    There were no vitals filed for this visit.  Subjective Assessment - 08/26/18 1352    Subjective  R shoulder is doing a little bit better. Hurts when she moves her R arm back a little. 5/10 currently.     Pertinent History  S/P R rotator cuff surgery on 06/11/2018. Pt was lifting a bag of linnen at work last year (07/01/2017) which resulted in injury. Thought her shoulder would get better on its own but it got worse.  Had PT at [facility] which did not fix it.   Has not had any PT since her surgery. Was told to perform pendulums at home.  Forgot to use her abduction pillow for her sling.  Also feels pain at the top of her R shoulder blade.  Pt is R hand dominant.  No falls within the last 6 months.  No fear of falling.      Patient Stated Goals  Wants to be better able to use her R arm to take a shower, and don and doff clothes     Currently in Pain?  Yes    Pain Score  5     Pain Onset  More than a month ago                               PT Education - 08/26/18 1400    Education Details  ther-ex    Person(s) Educated  Patient    Methods  Explanation;Demonstration;Tactile cues;Verbal cues    Comprehension  Returned demonstration;Verbalized understanding      Objectives  Pt currently 11 weeks post op   Therapeutic exercise  SupineR shoulderflexion AAROMusing L UE2 x 10;             120 degrees supine R shoulder flexion AAROM  Supine R shoulder scaption AAROM with rod 10x2    Supine R shoulder ER AAROM with rod in scapular plane 10x2    Supine R shoulder in 90 degrees flexion: rhythmic stabilization (gentle) 10x5 seconds for 2 sets   S/L   R shoulder ER 5x4 (upgraded number of sets). No pain.    Reclined:               L shoulder AAROM flexion 5x3 using L UE. 116 degrees AAROM flexion              L shoulder scaption AAROM with PT (difficulty with rod assist)  5x 3. L anterior shoulder soreness  Seated with R arm in neutral: submax isometrics (pain-free level of effort) ER and IR5s hold x10for2sets each way  Manually resisted scapular retraction targeting the lower trap muscles 10x5 seconds for 3 sets    Improved exercise technique, movement at target joints, use of target muscles after mod verbal, visual, tactile cues.    Manual therapy   Seated STM R upper trap muscles   R shoulder feels better afterwards per pt. 3/10 R shoulder pain afterwards.        Improving supine L shoulder flexion AAROM. Continued working on AAROM, gentle strengthening, and stability to promote ability to raise her R arm up against gravity when appropriate. Decreased R shoulder pain following manual therapy to decrease R upper trap muscle tension.                 PT Short Term Goals - 08/06/18 1749      PT SHORT TERM GOAL #1   Title  Patient will be independent with her HEP to promote ability to raise her R arm against gravity and use it for functional tasks when appropriate.     Time  3    Period  Weeks    Status  On-going    Target Date  08/29/18        PT Long Term Goals - 08/06/18 1750      PT LONG TERM GOAL #1   Title  Patient will improve R shoulder flexion PROM to at least 90 degrees flexion and scaption/abduction to at least 90 degrees to promote ability to raise her R arm, use her R UE to perform functional tasks when appropriate.     Baseline  R shoulder PROM: 75 degrees flexion, 64 degrees abduction (06/25/2018); 90 degrees PROM for both flexion and scaption (07/17/2018)    Time  6    Period  Weeks    Status  Achieved      PT LONG TERM GOAL #2   Title  Patient will improve R shoulder ER PROM to at least 70 degrees and IR PROM to at least 90 degrees to promote ability to use her R UE for self care as well as promote ability to don and doff clothes when appropriate.     Baseline  R shoulder PROM in scapular plane: 35 degrees ER, 72 degrees IR (06/25/2018); 54 degrees ER, 76 degrees IR (07/17/2018); 53 degrees ER AAROM, 60 degrees IR AAROM (08/06/2018)    Time  6    Period  Weeks    Status  Partially Met    Target Date  09/19/18      PT LONG TERM GOAL #3   Title  Pt will improve her R shoulder FOTO score by at least 20 points as a demonstration of improved function.     Baseline  R shoulder FOTO 4 (06/25/2018)    Time  6    Period  Weeks    Status  On-going    Target Date  09/19/18      PT LONG TERM GOAL #4   Title  Patient will   improve R shoulder flexion and scaption AAROM to 130 degrees or more to promote ability to raise her R arm when appropriate.     Baseline  AAROM not yet performed secondary to PROM for first 6 weeks (07/17/2018); supine AAROM:  115 degrees flexion, 122 degrees scaption (08/06/2018)      Time  6    Period  Weeks    Status  On-going    Target Date  09/19/18      PT LONG TERM GOAL #5   Title  Patient will have at least 4/5 R shoulder flexion, scaption, ER and IR strength to promote ability to utilize her R UE to perform funcitonal tasks when appropriate.     Baseline  Strength not tested to allow for more healing (07/17/2018); Not yet tested (08/06/2018)    Time  6    Period  Weeks    Status  On-going    Target Date  09/19/18            Plan - 08/26/18 1401    Clinical Impression Statement  Improving supine L shoulder flexion AAROM. Continued working on [employer], gentle strengthening, and stability to promote ability to raise her R arm up against gravity when appropriate. Decreased R shoulder pain following manual therapy to decrease R upper trap muscle tension.     Rehab Potential  Fair    Clinical Impairments Affecting Rehab Potential  (-) age, chronicity of condition prior to surgery, healing time; (+) motivated    PT Frequency  2x / week    PT Duration  6 weeks    PT Treatment/Interventions  Manual techniques;Neuromuscular re-education;Therapeutic activities;Therapeutic exercise;Patient/family education;Passive range of motion;Dry needling;Aquatic Therapy;Electrical Stimulation;Iontophoresis 43m/ml Dexamethasone;Ultrasound    PT Next Visit Plan  PROM, AAROM R shoulder, no overhead motions, no overhead lifting for first 6 weeks. Can [place] protocol per MCarlynn SpryPA-C    Consulted and Agree with Plan of Care  Patient       Patient will benefit from skilled therapeutic intervention in order to improve the following deficits and impairments:  Pain, Postural dysfunction, Improper body mechanics, Impaired UE functional use, Decreased strength, Decreased range of motion  Visit Diagnosis: Right shoulder pain, unspecified chronicity  Muscle weakness (generalized)     Problem List There are no active problems to display for this  patient.   MJoneen BoersPT, DPT   08/26/2018, 3:43 PM  [facility] [address] [place] [address]Phone: [phone]  Fax:  [phone] Name: Alyssa DOLECKIMRN: [id number] of Birth: 130-Aug-1947

## 2018-08-29 ENCOUNTER — Ambulatory Visit: Payer: PRIVATE HEALTH INSURANCE

## 2018-08-29 DIAGNOSIS — M6281 Muscle weakness (generalized): Secondary | ICD-10-CM

## 2018-08-29 DIAGNOSIS — M25511 Pain in right shoulder: Secondary | ICD-10-CM

## 2018-08-29 NOTE — Therapy (Signed)
[facility] [address] [place], [address] Phone: [phone]   Fax:  [phone]  Physical Therapy Treatment And Progress Report  Patient Details  Name: Alyssa Baird MRN: [id number] Date of Birth: December 14, 1945 Referring Provider (PT): Kurtis Bushman, MD   Encounter Date: 08/29/2018  PT End of Session - 08/29/18 1303    Visit Number  17    Number of Visits  25    Date for PT Re-Evaluation  09/19/18    Authorization Type  5    Authorization Time Period  of 12 worker's comp    PT Start Time  1302    PT Stop Time  1343    PT Time Calculation (min)  41 min    Activity Tolerance  Patient tolerated treatment well    Behavior During Therapy  [facility] for tasks assessed/performed       Past Medical History:  Diagnosis Date  . Arthritis   . Asthma    only URI  . Hyperlipidemia   . Hypertension     Past Surgical History:  Procedure Laterality Date  . ABDOMINAL HYSTERECTOMY    . CHOLECYSTECTOMY    . SHOULDER ARTHROSCOPY WITH ROTATOR CUFF REPAIR Right 06/11/2018   Procedure: SHOULDER ARTHROSCOPY WITH ROTATOR CUFF REPAIR;  Surgeon: Lovell Sheehan, MD;  Location: [facility] OR[facility];  Service: Orthopedics;  Laterality: Right;    There were no vitals filed for this visit.  Subjective Assessment - 08/29/18 1305    Subjective  R shoulder is a little sore today. 6/10 currently. Has an appointment around 2:15 pm at [facility].     Pertinent History  S/P R rotator cuff surgery on 06/11/2018. Pt was lifting a bag of linnen at work last year (07/01/2017) which resulted in injury. Thought her shoulder would get better on its own but it got worse.  Had PT at [facility] which did not fix it.   Has not had any PT since her surgery. Was told to perform pendulums at home.  Forgot to use her abduction pillow for her sling.  Also feels pain at the top of her R shoulder blade.  Pt is R hand dominant.  No falls within the last 6 months.   No fear of falling.     Patient Stated Goals  Wants to be better able to use her R arm to take a shower, and don and doff clothes     Currently in Pain?  Yes    Pain Score  6     Pain Onset  More than a month ago         [facility] PT Assessment - 08/29/18 1334      Strength   Right Shoulder Internal Rotation  4-/5   at least   Right Shoulder External Rotation  4-/5   at least                          PT Education - 08/29/18 1306    Education Details  ther-ex    Person(s) Educated  Patient    Methods  Explanation;Demonstration;Tactile cues;Verbal cues    Comprehension  Returned demonstration;Verbalized understanding        Objectives  Pt currently 11-12 weeks post op   Therapeutic exercise  SupineR shoulderflexion AAROMusing L UE2 x 10; 121 degrees supine R shoulder flexion AAROM  Supine R shoulder scaption AAROM with rod 10x2  130 degrees  Supine R shoulder in 90 degrees flexion: rhythmic stabilization (gentle) 5x 10 seconds for 2 sets   Supine R shoulder ER AAROM with rod in scapular plane 10x2  63 degrees ER AAROM   Supine R shoulder IR AAROM in scapular plane  70 degrees AAROM IR  S/L R shoulder ER 10x2 (upgraded number of reps). No pain.   Reclined:  L shoulder AAROM flexion 5x3 using L UE. 120 degrees AAROM flexion  L shoulder scaption AAROM with PT (difficulty with rod assist) 5x 4. L anterior shoulder soreness   Seated with R arm in neutral: submax isometrics (pain-free level of effort) ER and IR5s hold x20fr2sets, then 10x 5 second holds  each way   Manually resisted scapular retraction targeting the lower trap muscles 10x5 seconds for 3 sets   Improved exercise technique, movement at target joints, use of target muscles after mod verbal, visual, tactile cues.    Pt currently 11 weeks post op. She demonstrates overall improved supine R shoulder flexion,  scaption, ER AAROM, seated ER and IR strength, and improved function based on FOTO scores since initial evaluation. Still demonstrates 6/10 pain at start of session consistently in which movement and manual therapy to decrease tension to R upper trap helps improve comfort. Symptoms however return next visit. Patient still demonstrates pain, limited R shoulder ROM, weakness, and difficulty performing functional tasks and would benefit from continued skilled physical therapy services to address the aforementioned deficits.         PT Short Term Goals - 08/29/18 1600      PT SHORT TERM GOAL #1   Title  Patient will be independent with her HEP to promote ability to raise her R arm against gravity and use it for functional tasks when appropriate.     Time  3    Period  Weeks    Status  On-going    Target Date  09/19/18        PT Long Term Goals - 08/29/18 1316      PT LONG TERM GOAL #1   Title  Patient will improve R shoulder flexion PROM to at least 90 degrees flexion and scaption/abduction to at least 90 degrees to promote ability to raise her R arm, use her R UE to perform functional tasks when appropriate.     Baseline  R shoulder PROM: 75 degrees flexion, 64 degrees abduction (06/25/2018); 90 degrees PROM for both flexion and scaption (07/17/2018)    Time  6    Period  Weeks    Status  Achieved      PT LONG TERM GOAL #2   Title  Patient will improve R shoulder ER PROM to at least 70 degrees and IR PROM to at least 90 degrees to promote ability to use her R UE for self care as well as promote ability to don and doff clothes when appropriate.     Baseline  R shoulder PROM in scapular plane: 35 degrees ER, 72 degrees IR (06/25/2018); 54 degrees ER, 76 degrees IR (07/17/2018); 53 degrees ER AAROM, 60 degrees IR AAROM (08/06/2018); 63 degrees ER,  70 degrees IR AAROM (08/29/2018)    Time  6    Period  Weeks    Status  Partially Met    Target Date  09/19/18      PT LONG TERM GOAL #3    Title  Pt will improve her R shoulder FOTO score by at least 20 points as a demonstration of improved function.  Baseline  R shoulder FOTO 4 (06/25/2018); 45 (08/29/2018)    Time  6    Period  Weeks    Status  Achieved    Target Date  09/19/18      PT LONG TERM GOAL #4   Title  Patient will improve R shoulder flexion and scaption AAROM to 130 degrees or more to promote ability to raise her R arm when appropriate.     Baseline  AAROM not yet performed secondary to PROM for first 6 weeks (07/17/2018); supine AAROM:  115 degrees flexion, 122 degrees scaption (08/06/2018), 121 degrees flexion, 130 degrees scaption (08/29/2018)    Time  6    Period  Weeks    Status  On-going    Target Date  09/19/18      PT LONG TERM GOAL #5   Title  Patient will have at least 4/5 R shoulder flexion, scaption, ER and IR strength to promote ability to utilize her R UE to perform funcitonal tasks when appropriate.     Baseline  Strength not tested to allow for more healing (07/17/2018); Not yet tested (08/06/2018); R shoulder ER and IR at least 4-/5. Shoulder flexion and scaption not yet tested (not yet able to test at 90 degrees flexion or scaption) 08/29/2018)    Time  6    Period  Weeks    Status  On-going    Target Date  09/19/18      Additional Long Term Goals   Additional Long Term Goals  Yes      PT LONG TERM GOAL #6   Title  Pt will improve her R shoulder FOTO score to at least 60 points as a demonstration of improved function.     Baseline  45 (08/29/2018)    Time  6    Period  Weeks    Status  New    Target Date  10/10/18            Plan - 08/29/18 1306    Clinical Impression Statement  Pt currently 11 weeks post op. She demonstrates overall improved supine R shoulder flexion, scaption, ER AAROM, seated ER and IR strength, and improved function based on FOTO scores since initial evaluation. Still demonstrates 6/10 pain at start of session consistently in which movement and manual therapy to  decrease tension to R upper trap helps improve comfort. Symptoms however return next visit. Patient still demonstrates pain, limited R shoulder ROM, weakness, and difficulty performing functional tasks and would benefit from continued skilled physical therapy services to address the aforementioned deficits.      Rehab Potential  Fair    Clinical Impairments Affecting Rehab Potential  (-) age, chronicity of condition prior to surgery, healing time; (+) motivated    PT Frequency  2x / week    PT Duration  6 weeks    PT Treatment/Interventions  Manual techniques;Neuromuscular re-education;Therapeutic activities;Therapeutic exercise;Patient/family education;Passive range of motion;Dry needling;Aquatic Therapy;Electrical Stimulation;Iontophoresis '4mg'$ /ml Dexamethasone;Ultrasound    PT Next Visit Plan  PROM, AAROM R shoulder, no overhead motions, no overhead lifting for first 6 weeks. Can follow [place] rotator cuff protocol per Carlynn Spry PA-C    Consulted and Agree with Plan of Care  Patient       Patient will benefit from skilled therapeutic intervention in order to improve the following deficits and impairments:  Pain, Postural dysfunction, Improper body mechanics, Impaired UE functional use, Decreased strength, Decreased range of motion  Visit Diagnosis: Right shoulder pain, unspecified chronicity  Muscle weakness (generalized)     Problem List There are no active problems to display for this patient.  Thank you for your referral.  Joneen Boers PT, DPT   08/29/2018, 9:53 PM  [facility] [address], [place], [address] Phone: [phone]   Fax:  [phone]  Name: Alyssa Baird MRN: [id number] Date of Birth: 1946/02/23

## 2018-09-02 ENCOUNTER — Ambulatory Visit: Payer: PRIVATE HEALTH INSURANCE

## 2018-09-02 DIAGNOSIS — M25511 Pain in right shoulder: Secondary | ICD-10-CM

## 2018-09-02 DIAGNOSIS — M6281 Muscle weakness (generalized): Secondary | ICD-10-CM

## 2018-09-02 NOTE — Therapy (Signed)
[facility] [address] [place], [address] Phone: [phone]   Fax:  [phone]  Physical Therapy Treatment  Patient Details  Name: Alyssa Baird MRN: [id number] Date of Birth: [birth year] Referring Provider (PT): Kurtis Bushman, MD   Encounter Date: 09/02/2018  PT End of Session - 09/02/18 1349    Visit Number  18    Number of Visits  25    Date for PT Re-Evaluation  09/19/18    Authorization Type  6    Authorization Time Period  of 12 worker's comp    PT Start Time  1348    PT Stop Time  1430    PT Time Calculation (min)  42 min    Activity Tolerance  Patient tolerated treatment well    Behavior During Therapy  [facility] for tasks assessed/performed       Past Medical History:  Diagnosis Date  . Arthritis   . Asthma    only URI  . Hyperlipidemia   . Hypertension     Past Surgical History:  Procedure Laterality Date  . ABDOMINAL HYSTERECTOMY    . CHOLECYSTECTOMY    . SHOULDER ARTHROSCOPY WITH ROTATOR CUFF REPAIR Right 06/11/2018   Procedure: SHOULDER ARTHROSCOPY WITH ROTATOR CUFF REPAIR;  Surgeon: Lovell Sheehan, MD;  Location: [facility] ORS;  Service: Orthopedics;  Laterality: Right;    There were no vitals filed for this visit.  Subjective Assessment - 09/02/18 1346    Subjective  Pt is doing well today. Reports less shoulder pain currently and rates it as a 5/10. States that she had a good appointment with her MD.     Pertinent History  S/P R rotator cuff surgery on 06/11/2018. Pt was lifting a bag of linnen at work last year (07/01/2017) which resulted in injury. Thought her shoulder would get better on its own but it got worse.  Had PT at [facility] which did not fix it.   Has not had any PT since her surgery. Was told to perform pendulums at home.  Forgot to use her abduction pillow for her sling.  Also feels pain at the top of her R shoulder blade.  Pt is R hand dominant.  No falls within the last  6 months.  No fear of falling.     Patient Stated Goals  Wants to be better able to use her R arm to take a shower, and don and doff clothes     Currently in Pain?  Yes    Pain Score  5     Pain Location  Shoulder    Pain Orientation  Right    Pain Descriptors / Indicators  Aching    Pain Type  Chronic pain    Pain Onset  More than a month ago         TREATMENT  Therapeutic exercise SupineR shoulderflexion AAROMusing LUE2 x 10; Supine R shoulder scaption AAROM with cane 2 x 10; Supine R shoulder ER AAROM with cane in scapular plane 2 x 10; Supine R shoulder serratus punch with manual resistance 2 x 15; Supine R shoulder in 90 degrees flexion: rhythmic stabilization (gentle) 30 seconds for 2 sets L sidelying R shoulder ER 2 x 10, no pain. Supine with R arm in neutral submax isometrics (pain-free level of effort) flexion, extension (towel roll behind elbow), abduction, adduction, ER, and IR5s hold x10 each; Supine R shoulder AROM flexion to 90 degrees with bent elbow x  10; L sidelying R shoulder AROM abduction to 90 degrees x 10; L sidelying R scapula isometric resisted depression and retraction 5s hold x 10 each;  Pt educated throughout session about proper posture and technique with exercises. Improved exercise technique, movement at target joints, use of target muscles after min to mod verbal, visual, tactile cues.   Pt making good progress with physical therapy on this date. Incorporated R shoulder AROM flexion and abduction due to the fact that pt is now 12 weeks post-op. She has more weakness in sidelying abduction than flexion. Patient still demonstrates pain, limited R shoulder ROM, weakness, and difficulty performing functional tasks and would benefit from continued skilled physical therapy services to address the aforementioned deficits.                          PT Short Term Goals - 08/29/18 1600      PT SHORT TERM GOAL #1   Title  Patient  will be independent with her HEP to promote ability to raise her R arm against gravity and use it for functional tasks when appropriate.     Time  3    Period  Weeks    Status  On-going    Target Date  09/19/18        PT Long Term Goals - 08/29/18 1316      PT LONG TERM GOAL #1   Title  Patient will improve R shoulder flexion PROM to at least 90 degrees flexion and scaption/abduction to at least 90 degrees to promote ability to raise her R arm, use her R UE to perform functional tasks when appropriate.     Baseline  R shoulder PROM: 75 degrees flexion, 64 degrees abduction (06/25/2018); 90 degrees PROM for both flexion and scaption (07/17/2018)    Time  6    Period  Weeks    Status  Achieved      PT LONG TERM GOAL #2   Title  Patient will improve R shoulder ER PROM to at least 70 degrees and IR PROM to at least 90 degrees to promote ability to use her R UE for self care as well as promote ability to don and doff clothes when appropriate.     Baseline  R shoulder PROM in scapular plane: 35 degrees ER, 72 degrees IR (06/25/2018); 54 degrees ER, 76 degrees IR (07/17/2018); 53 degrees ER AAROM, 60 degrees IR AAROM (08/06/2018); 63 degrees ER,  70 degrees IR AAROM (08/29/2018)    Time  6    Period  Weeks    Status  Partially Met    Target Date  09/19/18      PT LONG TERM GOAL #3   Title  Pt will improve her R shoulder FOTO score by at least 20 points as a demonstration of improved function.     Baseline  R shoulder FOTO 4 (06/25/2018); 45 (08/29/2018)    Time  6    Period  Weeks    Status  Achieved    Target Date  09/19/18      PT LONG TERM GOAL #4   Title  Patient will improve R shoulder flexion and scaption AAROM to 130 degrees or more to promote ability to raise her R arm when appropriate.     Baseline  AAROM not yet performed secondary to PROM for first 6 weeks (07/17/2018); supine AAROM:  115 degrees flexion, 122 degrees scaption (08/06/2018), 121 degrees flexion, 130 degrees  scaption  (08/29/2018)    Time  6    Period  Weeks    Status  On-going    Target Date  09/19/18      PT LONG TERM GOAL #5   Title  Patient will have at least 4/5 R shoulder flexion, scaption, ER and IR strength to promote ability to utilize her R UE to perform funcitonal tasks when appropriate.     Baseline  Strength not tested to allow for more healing (07/17/2018); Not yet tested (08/06/2018); R shoulder ER and IR at least 4-/5. Shoulder flexion and scaption not yet tested (not yet able to test at 90 degrees flexion or scaption) 08/29/2018)    Time  6    Period  Weeks    Status  On-going    Target Date  09/19/18      Additional Long Term Goals   Additional Long Term Goals  Yes      PT LONG TERM GOAL #6   Title  Pt will improve her R shoulder FOTO score to at least 60 points as a demonstration of improved function.     Baseline  45 (08/29/2018)    Time  6    Period  Weeks    Status  New    Target Date  10/10/18            Plan - 09/02/18 1350    Clinical Impression Statement  Pt making good progress with physical therapy on this date. Incorporated R shoulder AROM flexion and abduction due to the fact that pt is now 12 weeks post-op. She has more weakness in sidelying abduction than flexion. Patient still demonstrates pain, limited R shoulder ROM, weakness, and difficulty performing functional tasks and would benefit from continued skilled physical therapy services to address the aforementioned deficits.      Rehab Potential  Fair    Clinical Impairments Affecting Rehab Potential  (-) age, chronicity of condition prior to surgery, healing time; (+) motivated    PT Frequency  2x / week    PT Duration  6 weeks    PT Treatment/Interventions  Manual techniques;Neuromuscular re-education;Therapeutic activities;Therapeutic exercise;Patient/family education;Passive range of motion;Dry needling;Aquatic Therapy;Electrical Stimulation;Iontophoresis '4mg'$ /ml Dexamethasone;Ultrasound    PT Next Visit Plan   PROM, AAROM R shoulder, no overhead motions, no overhead lifting for first 6 weeks. Can follow [place] rotator cuff protocol per Carlynn Spry PA-C    Consulted and Agree with Plan of Care  Patient       Patient will benefit from skilled therapeutic intervention in order to improve the following deficits and impairments:  Pain, Postural dysfunction, Improper body mechanics, Impaired UE functional use, Decreased strength, Decreased range of motion  Visit Diagnosis: Right shoulder pain, unspecified chronicity  Muscle weakness (generalized)     Problem List There are no active problems to display for this patient.  Lyndel Safe Huprich PT, DPT, GCS  Huprich,Jason 09/02/2018, 2:41 PM  [facility] [address], [place], [address] Phone: [phone]   Fax:  [phone]  Name: CHEVON FOMBY MRN: [id number] Date of Birth: [birth year]

## 2018-09-05 ENCOUNTER — Ambulatory Visit: Payer: PRIVATE HEALTH INSURANCE | Attending: Orthopedic Surgery

## 2018-09-05 DIAGNOSIS — M6281 Muscle weakness (generalized): Secondary | ICD-10-CM | POA: Insufficient documentation

## 2018-09-05 DIAGNOSIS — M25511 Pain in right shoulder: Secondary | ICD-10-CM | POA: Diagnosis present

## 2018-09-05 NOTE — Therapy (Addendum)
[place] [facility] [address], [place], [address] Phone: [phone]   Fax:  [phone]  Physical Therapy Treatment  Patient Details  Name: Alyssa Baird MRN: [id number] Date of Birth: March 31, 1946 Referring Provider (PT): Kurtis Bushman, MD   Encounter Date: 09/05/2018  PT End of Session - 09/05/18 1325    Visit Number  19    Number of Visits  25    Date for PT Re-Evaluation  09/19/18    Authorization Type  7    Authorization Time Period  of 12 worker's comp    PT Start Time  1320    PT Stop Time  1345    PT Time Calculation (min)  25 min    Activity Tolerance  Patient tolerated treatment well    Behavior During Therapy  [facility] for tasks assessed/performed       Past Medical History:  Diagnosis Date  . Arthritis   . Asthma    only URI  . Hyperlipidemia   . Hypertension     Past Surgical History:  Procedure Laterality Date  . ABDOMINAL HYSTERECTOMY    . CHOLECYSTECTOMY    . SHOULDER ARTHROSCOPY WITH ROTATOR CUFF REPAIR Right 06/11/2018   Procedure: SHOULDER ARTHROSCOPY WITH ROTATOR CUFF REPAIR;  Surgeon: Lovell Sheehan, MD;  Location: [facility] ORS;  Service: Orthopedics;  Laterality: Right;    There were no vitals filed for this visit.  Subjective Assessment - 09/05/18 1324    Subjective  Pt is doing well today. A little increase in pain after last session but resolved quickly. Pt denies shoulder pain upon arrival today. No specific questions or concerns at this time.    Pertinent History  S/P R rotator cuff surgery on 06/11/2018. Pt was lifting a bag of linnen at work last year (07/01/2017) which resulted in injury. Thought her shoulder would get better on its own but it got worse.  Had PT at [facility] which did not fix it.   Has not had any PT since her surgery. Was told to perform pendulums at home.  Forgot to use her abduction pillow for her sling.  Also feels pain at the top of her R shoulder blade.  Pt  is R hand dominant.  No falls within the last 6 months.  No fear of falling.     Patient Stated Goals  Wants to be better able to use her R arm to take a shower, and don and doff clothes     Currently in Pain?  No/denies          TREATMENT  Therapeutic exercise SupineR shoulderflexion AAROMusing LUE x 10; Supine R shoulder scaption AAROM with cane  x 10; Supine R shoulder ER AAROM with cane in scapular plane  x 10; Supine R shoulder AROM flexion to pain-free available end range with straight elbow x 10; L sidelying R shoulder AROM abduction to 90 degrees x 10; L sidelying R shoulder ER2 x 10, no pain. Supine R shoulder serratus punch with manual resistance 2 x 10; Supine R shoulder in 90 degrees flexion: rhythmic stabilization (gentle)30 seconds for 2 sets Prone R shoulder "I"s into extension with 2# dumbbell x 10; Prone R shoulder low rows with 2# dumbbell 2 x 10; Cold pack applied at end of session (unbilled);   Pt educated throughout session about proper posture and technique with exercises. Improved exercise technique, movement at target joints, use of target muscles after min to mod verbal,  visual, tactile cues.     Pt arrived approximately 20 minutes late for session which limited today's activities. She continues making good progress with physical therapy on this date. Continued to incorporate R shoulder AROM flexion and abduction as the patient is now 12 weeks post-op. Patient still demonstrates pain, limited R shoulder ROM, weakness, and difficulty performing functional tasks and would benefit from continued skilled physical therapy services to address the aforementioned deficits.                        PT Short Term Goals - 08/29/18 1600      PT SHORT TERM GOAL #1   Title  Patient will be independent with her HEP to promote ability to raise her R arm against gravity and use it for functional tasks when appropriate.     Time  3     Period  Weeks    Status  On-going    Target Date  09/19/18        PT Long Term Goals - 08/29/18 1316      PT LONG TERM GOAL #1   Title  Patient will improve R shoulder flexion PROM to at least 90 degrees flexion and scaption/abduction to at least 90 degrees to promote ability to raise her R arm, use her R UE to perform functional tasks when appropriate.     Baseline  R shoulder PROM: 75 degrees flexion, 64 degrees abduction (06/25/2018); 90 degrees PROM for both flexion and scaption (07/17/2018)    Time  6    Period  Weeks    Status  Achieved      PT LONG TERM GOAL #2   Title  Patient will improve R shoulder ER PROM to at least 70 degrees and IR PROM to at least 90 degrees to promote ability to use her R UE for self care as well as promote ability to don and doff clothes when appropriate.     Baseline  R shoulder PROM in scapular plane: 35 degrees ER, 72 degrees IR (06/25/2018); 54 degrees ER, 76 degrees IR (07/17/2018); 53 degrees ER AAROM, 60 degrees IR AAROM (08/06/2018); 63 degrees ER,  70 degrees IR AAROM (08/29/2018)    Time  6    Period  Weeks    Status  Partially Met    Target Date  09/19/18      PT LONG TERM GOAL #3   Title  Pt will improve her R shoulder FOTO score by at least 20 points as a demonstration of improved function.     Baseline  R shoulder FOTO 4 (06/25/2018); 45 (08/29/2018)    Time  6    Period  Weeks    Status  Achieved    Target Date  09/19/18      PT LONG TERM GOAL #4   Title  Patient will improve R shoulder flexion and scaption AAROM to 130 degrees or more to promote ability to raise her R arm when appropriate.     Baseline  AAROM not yet performed secondary to PROM for first 6 weeks (07/17/2018); supine AAROM:  115 degrees flexion, 122 degrees scaption (08/06/2018), 121 degrees flexion, 130 degrees scaption (08/29/2018)    Time  6    Period  Weeks    Status  On-going    Target Date  09/19/18      PT LONG TERM GOAL #5   Title  Patient will have at least 4/5 R  shoulder flexion,  scaption, ER and IR strength to promote ability to utilize her R UE to perform funcitonal tasks when appropriate.     Baseline  Strength not tested to allow for more healing (07/17/2018); Not yet tested (08/06/2018); R shoulder ER and IR at least 4-/5. Shoulder flexion and scaption not yet tested (not yet able to test at 90 degrees flexion or scaption) 08/29/2018)    Time  6    Period  Weeks    Status  On-going    Target Date  09/19/18      Additional Long Term Goals   Additional Long Term Goals  Yes      PT LONG TERM GOAL #6   Title  Pt will improve her R shoulder FOTO score to at least 60 points as a demonstration of improved function.     Baseline  45 (08/29/2018)    Time  6    Period  Weeks    Status  New    Target Date  10/10/18            Plan - 09/05/18 1348    Clinical Impression Statement  Pt arrived approximately 20 minutes late for session which limited today's activities. She continues making good progress with physical therapy on this date. Continued to incorporate R shoulder AROM flexion and abduction as the patient is now 12 weeks post-op. Patient still demonstrates pain, limited R shoulder ROM, weakness, and difficulty performing functional tasks and would benefit from continued skilled physical therapy services to address the aforementioned deficits.    Rehab Potential  Fair    Clinical Impairments Affecting Rehab Potential  (-) age, chronicity of condition prior to surgery, healing time; (+) motivated    PT Frequency  2x / week    PT Duration  6 weeks    PT Treatment/Interventions  Manual techniques;Neuromuscular re-education;Therapeutic activities;Therapeutic exercise;Patient/family education;Passive range of motion;Dry needling;Aquatic Therapy;Electrical Stimulation;Iontophoresis 66m/ml Dexamethasone;Ultrasound    PT Next Visit Plan  PROM, AAROM R shoulder, no overhead motions, no overhead lifting for first 6 weeks. Can follow [place] rotator cuff protocol per MCarlynn SpryPA-C    Consulted and Agree with Plan of Care  Patient       Patient will benefit from skilled therapeutic intervention in order to improve the following deficits and impairments:  Pain, Postural dysfunction, Improper body mechanics, Impaired UE functional use, Decreased strength, Decreased range of motion  Visit Diagnosis: Right shoulder pain, unspecified chronicity  Muscle weakness (generalized)     Problem List There are no active problems to display for this patient.  JLyndel SafeHuprich PT, DPT, GCS  Huprich,Jason 09/05/2018, 1:53 PM  [facility] [address] [place] [address]Phone: [phone]  Fax:  [phone] Name: Alyssa SHEPERDMRN: [id number] of Birth: 104-Feb-1947

## 2018-09-09 ENCOUNTER — Ambulatory Visit: Payer: PRIVATE HEALTH INSURANCE

## 2018-09-12 ENCOUNTER — Ambulatory Visit: Payer: PRIVATE HEALTH INSURANCE

## 2018-09-12 DIAGNOSIS — M25511 Pain in right shoulder: Secondary | ICD-10-CM

## 2018-09-12 DIAGNOSIS — M6281 Muscle weakness (generalized): Secondary | ICD-10-CM

## 2018-09-12 NOTE — Therapy (Signed)
[facility] [address], [place], [address] Phone: [phone]   Fax:  [phone]  Physical Therapy Treatment  Patient Details  Name: Alyssa Baird MRN: [id number] Date of Birth: 1946/01/20 Referring Provider (PT): Kurtis Bushman, MD   Encounter Date: 09/12/2018  PT End of Session - 09/12/18 1334    Visit Number  20    Number of Visits  25    Date for PT Re-Evaluation  09/19/18    Authorization Type  8    Authorization Time Period  of 12 worker's comp    PT Start Time  8182    PT Stop Time  1417    PT Time Calculation (min)  43 min    Activity Tolerance  Patient tolerated treatment well    Behavior During Therapy  [facility] for tasks assessed/performed       Past Medical History:  Diagnosis Date  . Arthritis   . Asthma    only URI  . Hyperlipidemia   . Hypertension     Past Surgical History:  Procedure Laterality Date  . ABDOMINAL HYSTERECTOMY    . CHOLECYSTECTOMY    . SHOULDER ARTHROSCOPY WITH ROTATOR CUFF REPAIR Right 06/11/2018   Procedure: SHOULDER ARTHROSCOPY WITH ROTATOR CUFF REPAIR;  Surgeon: Lovell Sheehan, MD;  Location: [facility] ORS;  Service: Orthopedics;  Laterality: Right;    There were no vitals filed for this visit.  Subjective Assessment - 09/12/18 1335    Subjective  Pt states that she is wearing a mask because she is still coughing, from nasal drainage.  R shoulder feels good. Trying to raise her R arm up. Stops if it bothers her. Does not want to lay down due to increase cough from nasal drainage.     Pertinent History  S/P R rotator cuff surgery on 06/11/2018. Pt was lifting a bag of linnen at work last year (07/01/2017) which resulted in injury. Thought her shoulder would get better on its own but it got worse.  Had PT at [facility] which did not fix it.   Has not had any PT since her surgery. Was told to perform pendulums at home.  Forgot to use her abduction pillow for her sling.   Also feels pain at the top of her R shoulder blade.  Pt is R hand dominant.  No falls within the last 6 months.  No fear of falling.     Patient Stated Goals  Wants to be better able to use her R arm to take a shower, and don and doff clothes     Currently in Pain?  Yes    Pain Score  4                                PT Education - 09/12/18 1346    Education Details  ther-ex    Person(s) Educated  Patient    Methods  Explanation;Demonstration;Tactile cues;Verbal cues    Comprehension  Returned demonstration;Verbalized understanding       Objectives  13 weeks  Therapeutic exercise  Seated R shoulder flexion AAROM using L hand 5x3.  Reviewed and given as part of her HEP. Pt demonstrated and verbalized understanding.   Standing table slides scaption AAROM 5x3  Standing shoulder ER AROM 10x3  Standing R shoulder wall climb  Flexion 5x. Discomfort. Eases with rest and decreasing tension to upper trap  Manually  resisted R scapular retraction tarteging the lower trap muscles 10x5 seconds for 3 sets   Standing R shoulder wall climb  scaption 5x (after manual therapy)   112 degrees scaption AAROM standing  Standing R shoulder flexion isometrics 5x5 seconds, submax for 2 sets     Improved exercise technique, movement at target joints, use of target muscles after mod verbal, visual, tactile cues.    Manual therapy  Seated STM R upper trap and teres major muscles  Decreased shoulder pain    Worked on sitting or standing exercises today secondary to patient request due to increased cough when lying down. Performed AAROM shoulder flexion and scaption against gravity as well as scapular strengthening and anterior deltoid isometrics to promote ability to raise her arm up. Increased R shoulder discomfort at the R upper trap area when performing the wall finger climb exercises which decreased with treatment to decrease upper trap tension. Pt demonstrates  shoulder shrug compensation with flexion and scaption related activities, cues needed to correct. Pt states no R shoulder pain after session, just fatigue. Pt will benefit from continued skilled physical therapy services to improve strength, scapular control, ROM, and function.        PT Short Term Goals - 08/29/18 1600      PT SHORT TERM GOAL #1   Title  Patient will be independent with her HEP to promote ability to raise her R arm against gravity and use it for functional tasks when appropriate.     Time  3    Period  Weeks    Status  On-going    Target Date  09/19/18        PT Long Term Goals - 08/29/18 1316      PT LONG TERM GOAL #1   Title  Patient will improve R shoulder flexion PROM to at least 90 degrees flexion and scaption/abduction to at least 90 degrees to promote ability to raise her R arm, use her R UE to perform functional tasks when appropriate.     Baseline  R shoulder PROM: 75 degrees flexion, 64 degrees abduction (06/25/2018); 90 degrees PROM for both flexion and scaption (07/17/2018)    Time  6    Period  Weeks    Status  Achieved      PT LONG TERM GOAL #2   Title  Patient will improve R shoulder ER PROM to at least 70 degrees and IR PROM to at least 90 degrees to promote ability to use her R UE for self care as well as promote ability to don and doff clothes when appropriate.     Baseline  R shoulder PROM in scapular plane: 35 degrees ER, 72 degrees IR (06/25/2018); 54 degrees ER, 76 degrees IR (07/17/2018); 53 degrees ER AAROM, 60 degrees IR AAROM (08/06/2018); 63 degrees ER,  70 degrees IR AAROM (08/29/2018)    Time  6    Period  Weeks    Status  Partially Met    Target Date  09/19/18      PT LONG TERM GOAL #3   Title  Pt will improve her R shoulder FOTO score by at least 20 points as a demonstration of improved function.     Baseline  R shoulder FOTO 4 (06/25/2018); 45 (08/29/2018)    Time  6    Period  Weeks    Status  Achieved    Target Date  09/19/18       PT LONG TERM GOAL #4  Title  Patient will improve R shoulder flexion and scaption AAROM to 130 degrees or more to promote ability to raise her R arm when appropriate.     Baseline  AAROM not yet performed secondary to PROM for first 6 weeks (07/17/2018); supine AAROM:  115 degrees flexion, 122 degrees scaption (08/06/2018), 121 degrees flexion, 130 degrees scaption (08/29/2018)    Time  6    Period  Weeks    Status  On-going    Target Date  09/19/18      PT LONG TERM GOAL #5   Title  Patient will have at least 4/5 R shoulder flexion, scaption, ER and IR strength to promote ability to utilize her R UE to perform funcitonal tasks when appropriate.     Baseline  Strength not tested to allow for more healing (07/17/2018); Not yet tested (08/06/2018); R shoulder ER and IR at least 4-/5. Shoulder flexion and scaption not yet tested (not yet able to test at 90 degrees flexion or scaption) 08/29/2018)    Time  6    Period  Weeks    Status  On-going    Target Date  09/19/18      Additional Long Term Goals   Additional Long Term Goals  Yes      PT LONG TERM GOAL #6   Title  Pt will improve her R shoulder FOTO score to at least 60 points as a demonstration of improved function.     Baseline  45 (08/29/2018)    Time  6    Period  Weeks    Status  New    Target Date  10/10/18            Plan - 09/12/18 1429    Clinical Impression Statement  Worked on sitting or standing exercises today secondary to patient request due to increased cough when lying down. Performed AAROM shoulder flexion and scaption against gravity as well as scapular strengthening and anterior deltoid isometrics to promote ability to raise her arm up. Increased R shoulder discomfort at the R upper trap area when performing the wall finger climb exercises which decreased with treatment to decrease upper trap tension. Pt demonstrates shoulder shrug compensation with flexion and scaption related activities, cues needed to correct. Pt  states no R shoulder pain after session, just fatigue. Pt will benefit from continued skilled physical therapy services to improve strength, scapular control, ROM, and function.     Rehab Potential  Fair    Clinical Impairments Affecting Rehab Potential  (-) age, chronicity of condition prior to surgery, healing time; (+) motivated    PT Frequency  2x / week    PT Duration  6 weeks    PT Treatment/Interventions  Manual techniques;Neuromuscular re-education;Therapeutic activities;Therapeutic exercise;Patient/family education;Passive range of motion;Dry needling;Aquatic Therapy;Electrical Stimulation;Iontophoresis 81m/ml Dexamethasone;Ultrasound    PT Next Visit Plan  PROM, AAROM R shoulder, no overhead motions, no overhead lifting for first 6 weeks. Can follow MChildressrotator cuff protocol per MCarlynn SpryPA-C    Consulted and Agree with Plan of Care  Patient       Patient will benefit from skilled therapeutic intervention in order to improve the following deficits and impairments:  Pain, Postural dysfunction, Improper body mechanics, Impaired UE functional use, Decreased strength, Decreased range of motion  Visit Diagnosis: Right shoulder pain, unspecified chronicity  Muscle weakness (generalized)     Problem List There are no active problems to display for this patient.  MJoneen BoersPT, DPT  09/12/2018, 2:35 PM  [facility] [address], [place], [address] Phone: [phone]   Fax:  [phone]  Name: Alyssa Baird MRN: [id number] Date of Birth: 08-21-1946

## 2018-09-12 NOTE — Patient Instructions (Signed)
Seated R shoulder flexion AAROM using L hand 5x3.  Reviewed and given as part of her HEP. Pt demonstrated and verbalized understanding.

## 2018-09-16 ENCOUNTER — Ambulatory Visit: Payer: PRIVATE HEALTH INSURANCE

## 2018-09-16 DIAGNOSIS — M6281 Muscle weakness (generalized): Secondary | ICD-10-CM

## 2018-09-16 DIAGNOSIS — M25511 Pain in right shoulder: Secondary | ICD-10-CM | POA: Diagnosis not present

## 2018-09-16 NOTE — Patient Instructions (Addendum)
  MedbridgeAccess Code: [email or web address]   Standing Shoulder External Rotation with Resistance  Yellow band 5x3  Seated Shoulder Scaption AAROM with Pulley at Side  10x3   Seated Shoulder Flexion AAROM with Pulley Behind  10x3

## 2018-09-16 NOTE — Therapy (Signed)
[facility] [address] [place], [address] Phone: [phone]   Fax:  [phone]  Physical Therapy Treatment  Patient Details  Name: Alyssa Baird MRN: [id number] Date of Birth: 1946/03/16 Referring Provider (PT): Kurtis Bushman, MD   Encounter Date: 09/16/2018  PT End of Session - 09/16/18 1352    Visit Number  21    Number of Visits  25    Date for PT Re-Evaluation  09/19/18    Authorization Type  9    Authorization Time Period  of 12 worker's comp    PT Start Time  1352    PT Stop Time  1437    PT Time Calculation (min)  45 min    Activity Tolerance  Patient tolerated treatment well    Behavior During Therapy  [facility] for tasks assessed/performed       Past Medical History:  Diagnosis Date  . Arthritis   . Asthma    only URI  . Hyperlipidemia   . Hypertension     Past Surgical History:  Procedure Laterality Date  . ABDOMINAL HYSTERECTOMY    . CHOLECYSTECTOMY    . SHOULDER ARTHROSCOPY WITH ROTATOR CUFF REPAIR Right 06/11/2018   Procedure: SHOULDER ARTHROSCOPY WITH ROTATOR CUFF REPAIR;  Surgeon: Lovell Sheehan, MD;  Location: [facility] ORS;  Service: Orthopedics;  Laterality: Right;    There were no vitals filed for this visit.  Subjective Assessment - 09/16/18 1353    Subjective  Does not want to do anything lying down due to increased cough from her cold. R shoulder is doing a whole lot better. Able to raise it up further.      Pertinent History  S/P R rotator cuff surgery on 06/11/2018. Pt was lifting a bag of linnen at work last year (07/01/2017) which resulted in injury. Thought her shoulder would get better on its own but it got worse.  Had PT at [facility] which did not fix it.   Has not had any PT since her surgery. Was told to perform pendulums at home.  Forgot to use her abduction pillow for her sling.  Also feels pain at the top of her R shoulder blade.  Pt is R hand dominant.  No falls  within the last 6 months.  No fear of falling.     Patient Stated Goals  Wants to be better able to use her R arm to take a shower, and don and doff clothes     Currently in Pain?  Yes    Pain Score  4          [facility] PT Assessment - 09/16/18 1355      Observation/Other Assessments   Focus on Therapeutic Outcomes (FOTO)   53      AROM   Left Shoulder Flexion  65 Degrees    Left Shoulder ABduction  59 Degrees   Scaption                          PT Education - 09/16/18 1355    Education Details  ther-ex    Person(s) Educated  Patient    Methods  Explanation;Tactile cues;Demonstration;Verbal cues    Comprehension  Returned demonstration;Verbalized understanding        Objectives  14 weeks   No latex band allergies   MedbridgeAccess Code: [email or web address]    Pt states doing her HEP. Next MD appointment is 10/09/2018  Therapeutic exercise  Standing R shoulder flexion, scaption AROM  Standing R shoulder wall climb             Flexion 5x. With PT assist to pass painful arc   Then 5 x more. Decreased painful arc after activation of infraspinatus and scapular retractor muscles.   Yellow T-band B shoulder ER 5x3  Standing scapular retraction rows 10x3 resisting yellow band  Seated pulley scaption 10x3. 129 degrees scaption AAROM  Seated pulley flexion 10x3. 130 degrees flexion AAROM  Standing wall towel slides scaption 5x2  Reviewed HEP. Pt demonstrated and verbalized understanding. Handout provided.   Improved exercise technique, movement at target joints, use of target muscles after mod verbal, visual, tactile cues.    Manually resisted R scapular retraction tarteging the lower trap muscles 10x5 seconds for 3 sets    Standing R shoulder flexion isometrics 5x5 seconds, submax for 3 sets     Improved exercise technique, movement at target joints, use of target muscles after mod verbal, visual, tactile cues.   Improving R  shoulder flexion and scaption AAROM compared to previous sessions, especially following seated pulley exercises. Continued working on gentle R shoulder and scapular strengthening to promote ability to raise her R arm up and use it for functional tasks. Pt needed continuous cues to decrease R shoulder shrug when raising her R arm up.  Pt also demonstrates improved function based on her FOTO score.    PT Short Term Goals - 08/29/18 1600      PT SHORT TERM GOAL #1   Title  Patient will be independent with her HEP to promote ability to raise her R arm against gravity and use it for functional tasks when appropriate.     Time  3    Period  Weeks    Status  On-going    Target Date  09/19/18        PT Long Term Goals - 08/29/18 1316      PT LONG TERM GOAL #1   Title  Patient will improve R shoulder flexion PROM to at least 90 degrees flexion and scaption/abduction to at least 90 degrees to promote ability to raise her R arm, use her R UE to perform functional tasks when appropriate.     Baseline  R shoulder PROM: 75 degrees flexion, 64 degrees abduction (06/25/2018); 90 degrees PROM for both flexion and scaption (07/17/2018)    Time  6    Period  Weeks    Status  Achieved      PT LONG TERM GOAL #2   Title  Patient will improve R shoulder ER PROM to at least 70 degrees and IR PROM to at least 90 degrees to promote ability to use her R UE for self care as well as promote ability to don and doff clothes when appropriate.     Baseline  R shoulder PROM in scapular plane: 35 degrees ER, 72 degrees IR (06/25/2018); 54 degrees ER, 76 degrees IR (07/17/2018); 53 degrees ER AAROM, 60 degrees IR AAROM (08/06/2018); 63 degrees ER,  70 degrees IR AAROM (08/29/2018)    Time  6    Period  Weeks    Status  Partially Met    Target Date  09/19/18      PT LONG TERM GOAL #3   Title  Pt will improve her R shoulder FOTO score by at least 20 points as a demonstration of improved function.     Baseline  R shoulder FOTO  4 (06/25/2018); 45 (08/29/2018)    Time  6    Period  Weeks    Status  Achieved    Target Date  09/19/18      PT LONG TERM GOAL #4   Title  Patient will improve R shoulder flexion and scaption AAROM to 130 degrees or more to promote ability to raise her R arm when appropriate.     Baseline  AAROM not yet performed secondary to PROM for first 6 weeks (07/17/2018); supine AAROM:  115 degrees flexion, 122 degrees scaption (08/06/2018), 121 degrees flexion, 130 degrees scaption (08/29/2018)    Time  6    Period  Weeks    Status  On-going    Target Date  09/19/18      PT LONG TERM GOAL #5   Title  Patient will have at least 4/5 R shoulder flexion, scaption, ER and IR strength to promote ability to utilize her R UE to perform funcitonal tasks when appropriate.     Baseline  Strength not tested to allow for more healing (07/17/2018); Not yet tested (08/06/2018); R shoulder ER and IR at least 4-/5. Shoulder flexion and scaption not yet tested (not yet able to test at 90 degrees flexion or scaption) 08/29/2018)    Time  6    Period  Weeks    Status  On-going    Target Date  09/19/18      Additional Long Term Goals   Additional Long Term Goals  Yes      PT LONG TERM GOAL #6   Title  Pt will improve her R shoulder FOTO score to at least 60 points as a demonstration of improved function.     Baseline  45 (08/29/2018)    Time  6    Period  Weeks    Status  New    Target Date  10/10/18            Plan - 09/16/18 1349    Clinical Impression Statement  Improving R shoulder flexion and scaption AAROM compared to previous sessions, especially following seated pulley exercises. Continued working on gentle R shoulder and scapular strengthening to promote ability to raise her R arm up and use it for functional tasks. Pt needed continuous cues to decrease R shoulder shrug when raising her R arm up.  Pt also demonstrates improved function based on her FOTO score.     Rehab Potential  Fair    Clinical  Impairments Affecting Rehab Potential  (-) age, chronicity of condition prior to surgery, healing time; (+) motivated    PT Frequency  2x / week    PT Duration  6 weeks    PT Treatment/Interventions  Manual techniques;Neuromuscular re-education;Therapeutic activities;Therapeutic exercise;Patient/family education;Passive range of motion;Dry needling;Aquatic Therapy;Electrical Stimulation;Iontophoresis '4mg'$ /ml Dexamethasone;Ultrasound    PT Next Visit Plan  PROM, AAROM R shoulder, no overhead motions, no overhead lifting for first 6 weeks. Can follow [place] rotator cuff protocol per Carlynn Spry PA-C    Consulted and Agree with Plan of Care  Patient       Patient will benefit from skilled therapeutic intervention in order to improve the following deficits and impairments:  Pain, Postural dysfunction, Improper body mechanics, Impaired UE functional use, Decreased strength, Decreased range of motion  Visit Diagnosis: Right shoulder pain, unspecified chronicity  Muscle weakness (generalized)     Problem List There are no active problems to display for this patient.   Joneen Boers PT, DPT   09/16/2018, 2:56  PM  [facility] [address] [place], [address] Phone: [phone]   Fax:  [phone]  Name: Alyssa Baird MRN: [id number] Date of Birth: 18-Aug-1946

## 2018-09-19 ENCOUNTER — Encounter: Payer: Self-pay | Admitting: Physical Therapy

## 2018-09-19 ENCOUNTER — Ambulatory Visit: Payer: PRIVATE HEALTH INSURANCE | Admitting: Physical Therapy

## 2018-09-19 DIAGNOSIS — M6281 Muscle weakness (generalized): Secondary | ICD-10-CM

## 2018-09-19 DIAGNOSIS — M25511 Pain in right shoulder: Secondary | ICD-10-CM | POA: Diagnosis not present

## 2018-09-19 NOTE — Therapy (Signed)
[facility] [address], [place], [address] Phone: [phone]   Fax:  [phone]  Physical Therapy Treatment and Re-Certification  Patient Details  Name: Alyssa Baird MRN: [id number] Date of Birth: January 20, 1946 Referring Provider (PT): Kurtis Bushman, MD   Dates of reporting period  08/06/2018   to   09/19/2018  Encounter Date: 09/19/2018  PT End of Session - 09/19/18 1956    Visit Number  22    Number of Visits  32    Date for PT Re-Evaluation  10/31/18    Authorization Type  22    Authorization Time Period  of 7 worker's comp    PT Start Time  [phone]    PT Stop Time  0930    PT Time Calculation (min)  40 min    Activity Tolerance  Patient tolerated treatment well    Behavior During Therapy  [facility] for tasks assessed/performed       Past Medical History:  Diagnosis Date  . Arthritis   . Asthma    only URI  . Hyperlipidemia   . Hypertension     Past Surgical History:  Procedure Laterality Date  . ABDOMINAL HYSTERECTOMY    . CHOLECYSTECTOMY    . SHOULDER ARTHROSCOPY WITH ROTATOR CUFF REPAIR Right 06/11/2018   Procedure: SHOULDER ARTHROSCOPY WITH ROTATOR CUFF REPAIR;  Surgeon: Lovell Sheehan, MD;  Location: [facility] ORS;  Service: Orthopedics;  Laterality: Right;    There were no vitals filed for this visit.  Subjective Assessment - 09/19/18 0857    Subjective  Patient reports she is feeling well today except some allergies that are making her cough. She reports completing her HEP 2x daily and using pullys at home. Patient reports she feels physical therapy is helping her a lot, she is able to reach better, her pain is lower.      Pertinent History  S/P R rotator cuff surgery on 06/11/2018. Pt was lifting a bag of linnen at work last year (07/01/2017) which resulted in injury. Thought her shoulder would get better on its own but it got worse.  Had PT at [facility] which did not fix it.   Has not had any  PT since her surgery. Was told to perform pendulums at home.  Forgot to use her abduction pillow for her sling.  Also feels pain at the top of her R shoulder blade.  Pt is R hand dominant.  No falls within the last 6 months.  No fear of falling.     Limitations  --   difficulty reaching, she is on light duty at work full time   Patient Stated Goals  Wants to be better able to use her R arm to take a shower, and don and doff clothes     Currently in Pain?  No/denies    Pain Score  0-No pain    Aggravating Factors   Movements involving moving R arm.          Objectives  14 weeks  No latex band allergies   MedbridgeAccess Code: [email or web address]    Pt states doing her HEP. Next MD appointment is 10/09/2018  Therapeutic exercise  Standing R shoulder flexion, scaption AROM  Yellow T-band B shoulder ER 10x2  Seated pulley scaption 10x3. 125 degrees scaption AAROM  Seated pulley flexion 10x3. 121 degrees flexion AAROM  Standing wall towel slides scaption 5x2.   Standing R shoulder flexion isometrics 5x5 seconds, submax for  3 sets  Improved exercise technique, movement at target joints, use of target muscles after mod verbal, visual, tactile cues.    Manual therapy: to reduce pain and tissue tension, improve range of motion, neuromodulation, in order to promote improved ability to complete functional activities. - PROM with gentle overpressure, x10 each direction, R shoulder flexion, abduction, ER. Pain at end range that eases when released.    ASSESSMENT:  Overall patient continues to progress towards goals and demonstrate improvements in range of motion, strength,  and self-reported function basedon FOTO score. Patient demonstrates improving R shoulder flexion and scaption AAROM compared to previous sessions, especially following seated pulley exercises. Patient continues to have deficits in strength, range of motion, motor control and is unable to complete  activities required for full duty full time work position held prior to injury. Recommend continuing with physical therapy interventions to continue addressing deficits and working towards stated goals and return to work. Plan to continue working on gentle R shoulder and scapular strengthening to promote ability to raise her R arm up and use it for functional tasks per protocol. Pt needed continuous cues to decrease R shoulder shrug when raising her R arm up.    [facility] PT Assessment - 09/19/18 0001      AROM   Right Shoulder Flexion  85 Degrees    Right Shoulder ABduction  55 Degrees    Right Shoulder External Rotation  50 Degrees      PROM   Right Shoulder Flexion  140 Degrees   end range pain   Right Shoulder ABduction  130 Degrees   end range pain   Right Shoulder Internal Rotation  60 Degrees    Right Shoulder External Rotation  66 Degrees   end range pain     Strength   Right Shoulder Flexion  3/5    Right Shoulder ABduction  3+/5    Right Shoulder Internal Rotation  4/5    Right Shoulder External Rotation  4/5       PT Education - 09/19/18 1955    Education Details  Education on diagnosis, prognosis, POC, anatomy and physiology of current condition. therex techniques.     Person(s) Educated  Patient    Methods  Explanation;Demonstration    Comprehension  Verbalized understanding;Returned demonstration       PT Short Term Goals - 09/19/18 0906      PT SHORT TERM GOAL #1   Title  Patient will be independent with her HEP to promote ability to raise her R arm against gravity and use it for functional tasks when appropriate.     Time  3    Period  Weeks    Status  Achieved    Target Date  09/19/18        PT Long Term Goals - 09/19/18 0906      PT LONG TERM GOAL #1   Title  Patient will improve R shoulder flexion PROM to at least 90 degrees flexion and scaption/abduction to at least 90 degrees to promote ability to raise her R arm, use her R UE to perform functional  tasks when appropriate.     Baseline  R shoulder PROM: 75 degrees flexion, 64 degrees abduction (06/25/2018); 90 degrees PROM for both flexion and scaption (07/17/2018); 140 degrees and 130 degrees (09/19/2018)    Time  6    Period  Weeks    Status  Achieved    Target Date  08/08/18      PT  LONG TERM GOAL #2   Title  Patient will improve R shoulder ER PROM to at least 70 degrees and IR PROM to at least 90 degrees to promote ability to use her R UE for self care as well as promote ability to don and doff clothes when appropriate.     Baseline  R shoulder PROM in scapular plane: 35 degrees ER, 72 degrees IR (06/25/2018); 54 degrees ER, 76 degrees IR (07/17/2018); 53 degrees ER AAROM, 60 degrees IR AAROM (08/06/2018); 63 degrees ER,  70 degrees IR AAROM (08/29/2018); 66 degrees ER,  60 degrees IR AAROM (09/19/2018)    Time  6    Period  Weeks    Status  Partially Met    Target Date  10/31/18      PT LONG TERM GOAL #3   Title  Pt will improve her R shoulder FOTO score by at least 20 points as a demonstration of improved function.     Baseline  R shoulder FOTO 4 (06/25/2018); 45 (08/29/2018)    Time  6    Period  Weeks    Status  Achieved    Target Date  09/19/18      PT LONG TERM GOAL #4   Title  Patient will improve R shoulder flexion and scaption AAROM to 130 degrees or more to promote ability to raise her R arm when appropriate.     Baseline  AAROM not yet performed secondary to PROM for first 6 weeks (07/17/2018); supine AAROM:  115 degrees flexion, 122 degrees scaption (08/06/2018), 121 degrees flexion, 130 degrees scaption (08/29/2018); 121 degrees flexion, 125 degrees scaption (09/19/2018)    Time  6    Period  Weeks    Status  On-going    Target Date  10/31/18      PT LONG TERM GOAL #5   Title  Patient will have at least 4/5 R shoulder flexion, scaption, ER and IR strength to promote ability to utilize her R UE to perform funcitonal tasks when appropriate.     Baseline  Strength not tested to  allow for more healing (07/17/2018); Not yet tested (08/06/2018); R shoulder ER and IR at least 4-/5. Shoulder flexion and scaption not yet tested (not yet able to test at 90 degrees flexion or scaption) 08/29/2018); R shoulder ER and IR at least 4/5. Shoulder flexion and scaption tested in available range with lmited resistance (not yet able to test at 90 degrees flexion or scaption 09/19/2018)    Time  6    Period  Weeks    Status  On-going    Target Date  10/31/18      Additional Long Term Goals   Additional Long Term Goals  Yes      PT LONG TERM GOAL #6   Title  Pt will improve her R shoulder FOTO score to at least 60 points as a demonstration of improved function.     Baseline  45 (08/29/2018); 53 (09/19/2018)    Time  6    Period  Weeks    Status  Partially Met    Target Date  10/31/18            Plan - 09/19/18 1958    Clinical Impression Statement  Overall patient continues to progress towards goals and demonstrate improvements in range of motion, strength,  and self-reported function basedon FOTO score. Patient demonstrates improving R shoulder flexion and scaption AAROM compared to previous sessions, especially following seated pulley exercises. Patient  continues to have deficits in strength, range of motion, motor control and is unable to complete activities required for full duty full time work position held prior to injury. Recommend continuing with physical therapy interventions to continue addressing deficits and working towards stated goals and return to work. Plan to continue working on gentle R shoulder and scapular strengthening to promote ability to raise her R arm up and use it for functional tasks per protocol. Pt needed continuous cues to decrease R shoulder shrug when raising her R arm up.     History and Personal Factors relevant to plan of care:  chronicity of condition prior to surgery, pain, weakness, TTP, dififculty reaching, using he rR UE for self care, donning and  doffing clothes.     Clinical Presentation due to:  Improved ROM, strength, and ability to use affected arm since initial eval.     Rehab Potential  Fair    Clinical Impairments Affecting Rehab Potential  (-) age, chronicity of condition prior to surgery, healing time; (+) motivated    PT Frequency  2x / week    PT Duration  6 weeks    PT Treatment/Interventions  Manual techniques;Neuromuscular re-education;Therapeutic activities;Therapeutic exercise;Patient/family education;Passive range of motion;Dry needling;Aquatic Therapy;Electrical Stimulation;Iontophoresis '4mg'$ /ml Dexamethasone;Ultrasound    PT Next Visit Plan  PROM, AAROM R shoulder, no overhead motions, no overhead lifting for first 6 weeks. Can follow [place] rotator cuff protocol per Carlynn Spry PA-C    Consulted and Agree with Plan of Care  Patient       Patient will benefit from skilled therapeutic intervention in order to improve the following deficits and impairments:  Pain, Postural dysfunction, Improper body mechanics, Impaired UE functional use, Decreased strength, Decreased range of motion  Visit Diagnosis: Right shoulder pain, unspecified chronicity - Plan: PT plan of care cert/re-cert  Muscle weakness (generalized) - Plan: PT plan of care cert/re-cert     Problem List There are no active problems to display for this patient.   Everlean Alstrom. Graylon Good, PT, DPT 09/19/18, 8:34 PM   [place] [address] [place], [address] Phone: [phone]   Fax:  [phone]  Name: Alyssa Baird MRN: [id number] Date of Birth: Nov 05, 1946

## 2018-09-23 ENCOUNTER — Ambulatory Visit: Payer: PRIVATE HEALTH INSURANCE

## 2018-09-23 DIAGNOSIS — M6281 Muscle weakness (generalized): Secondary | ICD-10-CM

## 2018-09-23 DIAGNOSIS — M25511 Pain in right shoulder: Secondary | ICD-10-CM

## 2018-09-23 NOTE — Therapy (Signed)
[place] [facility] [address] [place], [address] Phone: [phone]   Fax:  [phone]  Physical Therapy Treatment  Patient Details  Name: Alyssa Baird MRN: [id number] Date of Birth: 11-10-1946 Referring Provider (PT): Kurtis Bushman, MD   Encounter Date: 09/23/2018  PT End of Session - 09/23/18 1427    Visit Number  23    Number of Visits  32    Date for PT Re-Evaluation  10/31/18    Authorization Type  23    Authorization Time Period  of 32 worker's comp    PT Start Time  1351    PT Stop Time  1434    PT Time Calculation (min)  43 min    Activity Tolerance  Patient tolerated treatment well    Behavior During Therapy  WFL for tasks assessed/performed       Past Medical History:  Diagnosis Date  . Arthritis   . Asthma    only URI  . Hyperlipidemia   . Hypertension     Past Surgical History:  Procedure Laterality Date  . ABDOMINAL HYSTERECTOMY    . CHOLECYSTECTOMY    . SHOULDER ARTHROSCOPY WITH ROTATOR CUFF REPAIR Right 06/11/2018   Procedure: SHOULDER ARTHROSCOPY WITH ROTATOR CUFF REPAIR;  Surgeon: Lovell Sheehan, MD;  Location: [facility] ORS;  Service: Orthopedics;  Laterality: Right;    There were no vitals filed for this visit.  Subjective Assessment - 09/23/18 1351    Subjective  R shoulder is doing good. Still a little pain when using it more. Easing in to it.      Pertinent History  S/P R rotator cuff surgery on 06/11/2018. Pt was lifting a bag of linnen at work last year (07/01/2017) which resulted in injury. Thought her shoulder would get better on its own but it got worse.  Had PT at [facility] which did not fix it.   Has not had any PT since her surgery. Was told to perform pendulums at home.  Forgot to use her abduction pillow for her sling.  Also feels pain at the top of her R shoulder blade.  Pt is R hand dominant.  No falls within the last 6 months.  No fear of falling.     Limitations   --   difficulty reaching, she is on light duty at work full time   Patient Stated Goals  Wants to be better able to use her R arm to take a shower, and don and doff clothes     Currently in Pain?  No/denies    Pain Score  0-No pain                               PT Education - 09/23/18 1352    Education Details  ther-ex    Person(s) Educated  Patient    Methods  Explanation;Demonstration;Tactile cues;Verbal cues    Comprehension  Returned demonstration;Verbalized understanding        Objectives  15weeks  No latex band allergies   MedbridgeAccess Code: [email or web address]    Pt states doing her HEP. Next MD appointment is 10/09/2018  Therapeutic exercise  117 degrees R shoulder flexion in supine AROM at start of session   Supine with R arm in 90 degrees flexion  Rhythmic stabilization 10x5 seconds for 2 sets   standing finger wall climb  Flexion 5x  scaption 5x  Standing R  shoulder flexion isometrics 5x5 seconds, submax for3sets  Standing B scapular retraction resisting yellow band 10x, then 10x5 seconds. Easy   Then red band 10x5 seconds  Behind the back internal rotation R shoulder stretch 5x5 seconds for 2 sets   Improved exercise technique, movement at target joints, use of target muscles after min to mod verbal, visual, tactile cues.    Manual therapy   Supine with R arm in slight abduction, caudal glide R glenohumeral joint grade 3- to decrease stiffness  Improved supine R shoulder flexion AROM to 123 degrees  Supine with arm in flexion, STM to R teres major muscle area  Improved supine R shoulder flexion AROM following manual therapy to decrease stiffness inferior joint. Cues needed to decrease shoulder shrug and dissociate scapular and humeral movement when raising her arm. Continued working on scapular strengthening and shoulder ROM to promote ability to raise her R arm up. Pt will benefit from continued skilled  physical therapy services to promote ROM, strength, and function.      PT Short Term Goals - 09/19/18 0906      PT SHORT TERM GOAL #1   Title  Patient will be independent with her HEP to promote ability to raise her R arm against gravity and use it for functional tasks when appropriate.     Time  3    Period  Weeks    Status  Achieved    Target Date  09/19/18        PT Long Term Goals - 09/19/18 0906      PT LONG TERM GOAL #1   Title  Patient will improve R shoulder flexion PROM to at least 90 degrees flexion and scaption/abduction to at least 90 degrees to promote ability to raise her R arm, use her R UE to perform functional tasks when appropriate.     Baseline  R shoulder PROM: 75 degrees flexion, 64 degrees abduction (06/25/2018); 90 degrees PROM for both flexion and scaption (07/17/2018); 140 degrees and 130 degrees (09/19/2018)    Time  6    Period  Weeks    Status  Achieved    Target Date  08/08/18      PT LONG TERM GOAL #2   Title  Patient will improve R shoulder ER PROM to at least 70 degrees and IR PROM to at least 90 degrees to promote ability to use her R UE for self care as well as promote ability to don and doff clothes when appropriate.     Baseline  R shoulder PROM in scapular plane: 35 degrees ER, 72 degrees IR (06/25/2018); 54 degrees ER, 76 degrees IR (07/17/2018); 53 degrees ER AAROM, 60 degrees IR AAROM (08/06/2018); 63 degrees ER,  70 degrees IR AAROM (08/29/2018); 66 degrees ER,  60 degrees IR AAROM (09/19/2018)    Time  6    Period  Weeks    Status  Partially Met    Target Date  10/31/18      PT LONG TERM GOAL #3   Title  Pt will improve her R shoulder FOTO score by at least 20 points as a demonstration of improved function.     Baseline  R shoulder FOTO 4 (06/25/2018); 45 (08/29/2018)    Time  6    Period  Weeks    Status  Achieved    Target Date  09/19/18      PT LONG TERM GOAL #4   Title  Patient will improve R shoulder flexion and  scaption AAROM to 130  degrees or more to promote ability to raise her R arm when appropriate.     Baseline  AAROM not yet performed secondary to PROM for first 6 weeks (07/17/2018); supine AAROM:  115 degrees flexion, 122 degrees scaption (08/06/2018), 121 degrees flexion, 130 degrees scaption (08/29/2018); 121 degrees flexion, 125 degrees scaption (09/19/2018)    Time  6    Period  Weeks    Status  On-going    Target Date  10/31/18      PT LONG TERM GOAL #5   Title  Patient will have at least 4/5 R shoulder flexion, scaption, ER and IR strength to promote ability to utilize her R UE to perform funcitonal tasks when appropriate.     Baseline  Strength not tested to allow for more healing (07/17/2018); Not yet tested (08/06/2018); R shoulder ER and IR at least 4-/5. Shoulder flexion and scaption not yet tested (not yet able to test at 90 degrees flexion or scaption) 08/29/2018); R shoulder ER and IR at least 4/5. Shoulder flexion and scaption tested in available range with lmited resistance (not yet able to test at 90 degrees flexion or scaption 09/19/2018)    Time  6    Period  Weeks    Status  On-going    Target Date  10/31/18      Additional Long Term Goals   Additional Long Term Goals  Yes      PT LONG TERM GOAL #6   Title  Pt will improve her R shoulder FOTO score to at least 60 points as a demonstration of improved function.     Baseline  45 (08/29/2018); 53 (09/19/2018)    Time  6    Period  Weeks    Status  Partially Met    Target Date  10/31/18            Plan - 09/23/18 1417    Clinical Impression Statement  Improved supine R shoulder flexion AROM following manual therapy to decrease stiffness inferior joint. Cues needed to decrease shoulder shrug and dissociate scapular and humeral movement when raising her arm. Continued working on scapular strengthening and shoulder ROM to promote ability to raise her R arm up. Pt will benefit from continued skilled physical therapy services to promote ROM, strength,  and function.     Rehab Potential  Fair    Clinical Impairments Affecting Rehab Potential  (-) age, chronicity of condition prior to surgery, healing time; (+) motivated    PT Frequency  2x / week    PT Duration  6 weeks    PT Treatment/Interventions  Manual techniques;Neuromuscular re-education;Therapeutic activities;Therapeutic exercise;Patient/family education;Passive range of motion;Dry needling;Aquatic Therapy;Electrical Stimulation;Iontophoresis '4mg'$ /ml Dexamethasone;Ultrasound    PT Next Visit Plan  PROM, AAROM R shoulder, no overhead motions, no overhead lifting for first 6 weeks. Can follow [place] rotator cuff protocol per Carlynn Spry PA-C    Consulted and Agree with Plan of Care  Patient       Patient will benefit from skilled therapeutic intervention in order to improve the following deficits and impairments:  Pain, Postural dysfunction, Improper body mechanics, Impaired UE functional use, Decreased strength, Decreased range of motion  Visit Diagnosis: Right shoulder pain, unspecified chronicity  Muscle weakness (generalized)     Problem List There are no active problems to display for this patient.   Joneen Boers PT, DPT   09/23/2018, 2:44 PM  [facility] [place] [facility]  [address] [place], [address] Phone: [phone]   Fax:  [phone]  Name: Alyssa Baird MRN: [id number] Date of Birth: 1946/08/05

## 2018-09-24 ENCOUNTER — Ambulatory Visit: Payer: PRIVATE HEALTH INSURANCE

## 2018-09-30 ENCOUNTER — Encounter: Payer: Self-pay | Admitting: Physical Therapy

## 2018-09-30 ENCOUNTER — Ambulatory Visit: Payer: PRIVATE HEALTH INSURANCE

## 2018-09-30 DIAGNOSIS — M25511 Pain in right shoulder: Secondary | ICD-10-CM

## 2018-09-30 DIAGNOSIS — M6281 Muscle weakness (generalized): Secondary | ICD-10-CM

## 2018-09-30 NOTE — Therapy (Signed)
[facility] [address] [place], [address] Phone: [phone]   Fax:  [phone]  Physical Therapy Treatment  Patient Details  Name: Alyssa Baird MRN: [id number] Date of Birth: 02-Aug-1946 Referring Provider (PT): Kurtis Bushman, MD   Encounter Date: 09/30/2018  PT End of Session - 09/30/18 1434    Visit Number  24    Number of Visits  32    Date for PT Re-Evaluation  10/31/18    Authorization Type  24    PT Start Time  1436    PT Stop Time  1515    PT Time Calculation (min)  39 min    Activity Tolerance  Patient tolerated treatment well    Behavior During Therapy  [facility] for tasks assessed/performed       Past Medical History:  Diagnosis Date  . Arthritis   . Asthma    only URI  . Hyperlipidemia   . Hypertension     Past Surgical History:  Procedure Laterality Date  . ABDOMINAL HYSTERECTOMY    . CHOLECYSTECTOMY    . SHOULDER ARTHROSCOPY WITH ROTATOR CUFF REPAIR Right 06/11/2018   Procedure: SHOULDER ARTHROSCOPY WITH ROTATOR CUFF REPAIR;  Surgeon: Lovell Sheehan, MD;  Location: [facility] ORS;  Service: Orthopedics;  Laterality: Right;    There were no vitals filed for this visit.  Therapeutic exercise  Supine AROM x10 with improvement noted with rep Supine AAROM x10 with pause at top for stretch Supine AROM x10  Improvement in ROM noted for second set of AROM. Decreased R shoulder popping with scapular retraction   Supine with R arm in 90 degrees flexion             Rhythmic stabilization 5x5 seconds for sets  Rhythmic stabilization 3x30s with RTB   standing finger wall climb             Flexion 10x             scaption 10x   Standing B scapular retraction with RTB x25 Standing scapular retraction advanced with RTB x25 Standing B shoulder extension with RTB x25  Behind the back internal rotation R shoulder stretch 3x20s sets     PT Education - 09/30/18 1506    Education  Details  ther-ex    Person(s) Educated  Patient    Methods  Explanation;Demonstration;Tactile cues;Verbal cues    Comprehension  Verbalized understanding       PT Short Term Goals - 09/19/18 0906      PT SHORT TERM GOAL #1   Title  Patient will be independent with her HEP to promote ability to raise her R arm against gravity and use it for functional tasks when appropriate.     Time  3    Period  Weeks    Status  Achieved    Target Date  09/19/18        PT Long Term Goals - 09/19/18 0906      PT LONG TERM GOAL #1   Title  Patient will improve R shoulder flexion PROM to at least 90 degrees flexion and scaption/abduction to at least 90 degrees to promote ability to raise her R arm, use her R UE to perform functional tasks when appropriate.     Baseline  R shoulder PROM: 75 degrees flexion, 64 degrees abduction (06/25/2018); 90 degrees PROM for both flexion and scaption (07/17/2018); 140 degrees and 130 degrees (09/19/2018)    Time  6    Period  Weeks    Status  Achieved    Target Date  08/08/18      PT LONG TERM GOAL #2   Title  Patient will improve R shoulder ER PROM to at least 70 degrees and IR PROM to at least 90 degrees to promote ability to use her R UE for self care as well as promote ability to don and doff clothes when appropriate.     Baseline  R shoulder PROM in scapular plane: 35 degrees ER, 72 degrees IR (06/25/2018); 54 degrees ER, 76 degrees IR (07/17/2018); 53 degrees ER AAROM, 60 degrees IR AAROM (08/06/2018); 63 degrees ER,  70 degrees IR AAROM (08/29/2018); 66 degrees ER,  60 degrees IR AAROM (09/19/2018)    Time  6    Period  Weeks    Status  Partially Met    Target Date  10/31/18      PT LONG TERM GOAL #3   Title  Pt will improve her R shoulder FOTO score by at least 20 points as a demonstration of improved function.     Baseline  R shoulder FOTO 4 (06/25/2018); 45 (08/29/2018)    Time  6    Period  Weeks    Status  Achieved    Target Date  09/19/18      PT LONG  TERM GOAL #4   Title  Patient will improve R shoulder flexion and scaption AAROM to 130 degrees or more to promote ability to raise her R arm when appropriate.     Baseline  AAROM not yet performed secondary to PROM for first 6 weeks (07/17/2018); supine AAROM:  115 degrees flexion, 122 degrees scaption (08/06/2018), 121 degrees flexion, 130 degrees scaption (08/29/2018); 121 degrees flexion, 125 degrees scaption (09/19/2018)    Time  6    Period  Weeks    Status  On-going    Target Date  10/31/18      PT LONG TERM GOAL #5   Title  Patient will have at least 4/5 R shoulder flexion, scaption, ER and IR strength to promote ability to utilize her R UE to perform funcitonal tasks when appropriate.     Baseline  Strength not tested to allow for more healing (07/17/2018); Not yet tested (08/06/2018); R shoulder ER and IR at least 4-/5. Shoulder flexion and scaption not yet tested (not yet able to test at 90 degrees flexion or scaption) 08/29/2018); R shoulder ER and IR at least 4/5. Shoulder flexion and scaption tested in available range with lmited resistance (not yet able to test at 90 degrees flexion or scaption 09/19/2018)    Time  6    Period  Weeks    Status  On-going    Target Date  10/31/18      Additional Long Term Goals   Additional Long Term Goals  Yes      PT LONG TERM GOAL #6   Title  Pt will improve her R shoulder FOTO score to at least 60 points as a demonstration of improved function.     Baseline  45 (08/29/2018); 53 (09/19/2018)    Time  6    Period  Weeks    Status  Partially Met    Target Date  10/31/18            Plan - 09/30/18 1506    Clinical Impression Statement  Patient with most difficulty with standing shoulder AAROM (finger walks). Cues to avoid shoulder hiking,  scapular retraction. No pain or popping reported with scapular strengthening today. Patient with decreased pop/catch in supine with scapular retraction as well.    PT Frequency  2x / week    PT Duration  6  weeks    Consulted and Agree with Plan of Care  Patient       Patient will benefit from skilled therapeutic intervention in order to improve the following deficits and impairments:  Pain, Postural dysfunction, Improper body mechanics, Impaired UE functional use, Decreased strength, Decreased range of motion  Visit Diagnosis: Right shoulder pain, unspecified chronicity  Muscle weakness (generalized)     Problem List There are no active problems to display for this patient.   Lieutenant Diego PT, DPT 3:14 PM,09/30/18 [facility] [address] [place], [address] Phone: [phone]   Fax:  [phone]  Name: Alyssa Baird MRN: [id number] Date of Birth: 1946-07-09

## 2018-10-03 ENCOUNTER — Ambulatory Visit: Payer: PRIVATE HEALTH INSURANCE

## 2018-10-03 DIAGNOSIS — M25511 Pain in right shoulder: Secondary | ICD-10-CM

## 2018-10-03 DIAGNOSIS — M6281 Muscle weakness (generalized): Secondary | ICD-10-CM

## 2018-10-03 NOTE — Patient Instructions (Addendum)
MedbridgeAccess Code: [email or web address]   Supine Shoulder Flexion Extension Full Range AROM   And scaption    10x2 with elbow bent for each position daily.

## 2018-10-03 NOTE — Therapy (Signed)
[facility] [address], [place], [address] Phone: [phone]   Fax:  [phone]  Physical Therapy Treatment  Patient Details  Name: Alyssa Baird MRN: [id number] Date of Birth: Jun 19, 1946 Referring Provider (PT): Kurtis Bushman, MD   Encounter Date: 10/03/2018  PT End of Session - 10/03/18 1438    Visit Number  25    Number of Visits  32    Date for PT Re-Evaluation  10/31/18    Authorization Type  25    Authorization Time Period  of 32 worker's comp    PT Start Time  1438    PT Stop Time  1527    PT Time Calculation (min)  49 min    Activity Tolerance  Patient tolerated treatment well    Behavior During Therapy  WFL for tasks assessed/performed       Past Medical History:  Diagnosis Date  . Arthritis   . Asthma    only URI  . Hyperlipidemia   . Hypertension     Past Surgical History:  Procedure Laterality Date  . ABDOMINAL HYSTERECTOMY    . CHOLECYSTECTOMY    . SHOULDER ARTHROSCOPY WITH ROTATOR CUFF REPAIR Right 06/11/2018   Procedure: SHOULDER ARTHROSCOPY WITH ROTATOR CUFF REPAIR;  Surgeon: Lovell Sheehan, MD;  Location: [facility]S;  Service: Orthopedics;  Laterality: Right;    There were no vitals filed for this visit.  Subjective Assessment - 10/03/18 1439    Subjective  R shoulder is doing a whole lot better. Still sore when she lifts her arm to the side and forward.  3/10 currently     Pertinent History  S/P R rotator cuff surgery on 06/11/2018. Pt was lifting a bag of linnen at work last year (07/01/2017) which resulted in injury. Thought her shoulder would get better on its own but it got worse.  Had PT at [facility] which did not fix it.   Has not had any PT since her surgery. Was told to perform pendulums at home.  Forgot to use her abduction pillow for her sling.  Also feels pain at the top of her R shoulder blade.  Pt is R hand dominant.  No falls within the last 6 months.  No fear  of falling.     Currently in Pain?  Yes    Pain Score  3     Pain Onset  More than a month ago                               PT Education - 10/03/18 1513    Education Details  ther-ex    Person(s) Educated  Patient    Methods  Explanation;Demonstration;Tactile cues;Verbal cues    Comprehension  Returned demonstration;Verbalized understanding        Objectives  16weeks  No latex band allergies   MedbridgeAccess Code: [email or web address]    Pt states doing her HEP. Next MD appointment is 10/09/2018    Manual therapy    Supine with R arm in slight abduction, caudal glide R glenohumeral joint grade 3 to decrease stiffness        Supine with arm in flexion, STM to R teres major muscle area  Supine with arm in flexion, STM to distal pectoralis major  Supine with caudal pressure to R humeral head  Flexion PROM 10x3  scaption PROM 10x3    Therapeutic exercise  R shoulder AROM at start of session   Flexion: 65 degrees with shoulder shrug with anterior shoulder pull, eases with rest  Scaption: 65 degrees with shoulder shrug with anterior shoulder discomfort which eases with rest   Supine R shoulder AROM 10x2 starting with elbow bent. Cues for scapular retraction  140 degrees supine R shoulder flexion AROM  Supine R shoulder scaption AROM 10x2 starting with elbow bent  122 degrees supine R shoulder scaption AROM  Reviewed today's HEP. Pt demonstrated and verbalized understanding. Handout provided.   Improved exercise technique, movement at target joints, use of target muscles after min to mod verbal, visual, tactile cues.    Pt demonstrates stiffness to inferior glenohumeral joint capsule, as well as tension to teres major and distal pectoralis major muscles. Worked on manual therapy to help address. Able to achieve 140 degrees supine R shoulder flexion and at least 122 degrees supine R shoulder scaption AROM today. Difficulty  raising her R arm up in standing against more gravity resistance due to weakness. Patient will benefit from continued skilled physical therapy services to improve ROM, joint mobility, strength, and function.      PT Short Term Goals - 09/19/18 0906      PT SHORT TERM GOAL #1   Title  Patient will be independent with her HEP to promote ability to raise her R arm against gravity and use it for functional tasks when appropriate.     Time  3    Period  Weeks    Status  Achieved    Target Date  09/19/18        PT Long Term Goals - 09/19/18 0906      PT LONG TERM GOAL #1   Title  Patient will improve R shoulder flexion PROM to at least 90 degrees flexion and scaption/abduction to at least 90 degrees to promote ability to raise her R arm, use her R UE to perform functional tasks when appropriate.     Baseline  R shoulder PROM: 75 degrees flexion, 64 degrees abduction (06/25/2018); 90 degrees PROM for both flexion and scaption (07/17/2018); 140 degrees and 130 degrees (09/19/2018)    Time  6    Period  Weeks    Status  Achieved    Target Date  08/08/18      PT LONG TERM GOAL #2   Title  Patient will improve R shoulder ER PROM to at least 70 degrees and IR PROM to at least 90 degrees to promote ability to use her R UE for self care as well as promote ability to don and doff clothes when appropriate.     Baseline  R shoulder PROM in scapular plane: 35 degrees ER, 72 degrees IR (06/25/2018); 54 degrees ER, 76 degrees IR (07/17/2018); 53 degrees ER AAROM, 60 degrees IR AAROM (08/06/2018); 63 degrees ER,  70 degrees IR AAROM (08/29/2018); 66 degrees ER,  60 degrees IR AAROM (09/19/2018)    Time  6    Period  Weeks    Status  Partially Met    Target Date  10/31/18      PT LONG TERM GOAL #3   Title  Pt will improve her R shoulder FOTO score by at least 20 points as a demonstration of improved function.     Baseline  R shoulder FOTO 4 (06/25/2018); 45 (08/29/2018)    Time  6    Period  Weeks    Status   Achieved    Target Date  09/19/18      PT LONG TERM GOAL #4   Title  Patient will improve R shoulder flexion and scaption AAROM to 130 degrees or more to promote ability to raise her R arm when appropriate.     Baseline  AAROM not yet performed secondary to PROM for first 6 weeks (07/17/2018); supine AAROM:  115 degrees flexion, 122 degrees scaption (08/06/2018), 121 degrees flexion, 130 degrees scaption (08/29/2018); 121 degrees flexion, 125 degrees scaption (09/19/2018)    Time  6    Period  Weeks    Status  On-going    Target Date  10/31/18      PT LONG TERM GOAL #5   Title  Patient will have at least 4/5 R shoulder flexion, scaption, ER and IR strength to promote ability to utilize her R UE to perform funcitonal tasks when appropriate.     Baseline  Strength not tested to allow for more healing (07/17/2018); Not yet tested (08/06/2018); R shoulder ER and IR at least 4-/5. Shoulder flexion and scaption not yet tested (not yet able to test at 90 degrees flexion or scaption) 08/29/2018); R shoulder ER and IR at least 4/5. Shoulder flexion and scaption tested in available range with lmited resistance (not yet able to test at 90 degrees flexion or scaption 09/19/2018)    Time  6    Period  Weeks    Status  On-going    Target Date  10/31/18      Additional Long Term Goals   Additional Long Term Goals  Yes      PT LONG TERM GOAL #6   Title  Pt will improve her R shoulder FOTO score to at least 60 points as a demonstration of improved function.     Baseline  45 (08/29/2018); 53 (09/19/2018)    Time  6    Period  Weeks    Status  Partially Met    Target Date  10/31/18            Plan - 10/03/18 1513    Clinical Impression Statement  Pt demonstrates stiffness to inferior glenohumeral joint capsule, as well as tension to teres major and distal pectoralis major muscles. Worked on manual therapy to help address. Able to achieve 140 degrees supine R shoulder flexion and at least 122 degrees  supine R shoulder scaption AROM today. Difficulty raising her R arm up in standing against more gravity resistance due to weakness. Patient will benefit from continued skilled physical therapy services to improve ROM, joint mobility, strength, and function.     Clinical Impairments Affecting Rehab Potential  (-) age, chronicity of condition prior to surgery, healing time; (+) motivated    PT Frequency  2x / week    PT Duration  6 weeks    PT Treatment/Interventions  Manual techniques;Neuromuscular re-education;Therapeutic activities;Therapeutic exercise;Patient/family education;Passive range of motion;Dry needling;Aquatic Therapy;Electrical Stimulation;Iontophoresis '4mg'$ /ml Dexamethasone;Ultrasound    PT Next Visit Plan  PROM, AAROM R shoulder, no overhead motions, no overhead lifting for first 6 weeks. Can follow [place] rotator cuff protocol per Carlynn Spry PA-C    Consulted and Agree with Plan of Care  Patient       Patient will benefit from skilled therapeutic intervention in order to improve the following deficits and impairments:  Pain, Postural dysfunction, Improper body mechanics, Impaired UE functional use, Decreased strength, Decreased range of motion  Visit Diagnosis: Right shoulder pain, unspecified chronicity  Muscle weakness (generalized)     Problem List There  are no active problems to display for this patient.   Joneen Boers PT, DPT  10/03/2018, 3:37 PM  [facility] [address], [place], [address] Phone: [phone]   Fax:  [phone]  Name: Alyssa Baird MRN: [id number] Date of Birth: Oct 07, 1946

## 2018-10-07 ENCOUNTER — Ambulatory Visit: Payer: PRIVATE HEALTH INSURANCE | Attending: Orthopedic Surgery

## 2018-10-07 DIAGNOSIS — M6281 Muscle weakness (generalized): Secondary | ICD-10-CM | POA: Diagnosis present

## 2018-10-07 DIAGNOSIS — M25511 Pain in right shoulder: Secondary | ICD-10-CM | POA: Insufficient documentation

## 2018-10-07 NOTE — Therapy (Signed)
[facility] [address] [place], [address] Phone: [phone]   Fax:  [phone]  Physical Therapy Treatment And Progress Report   Patient Details  Name: Alyssa Baird MRN: [id number] Date of Birth: 13-Feb-1946 Referring Provider (PT): Kurtis Bushman, MD   Encounter Date: 10/07/2018  PT End of Session - 10/07/18 1650    Visit Number  26    Number of Visits  32    Date for PT Re-Evaluation  10/31/18    Authorization Type  26    Authorization Time Period  of 32 worker's comp    PT Start Time  6962    PT Stop Time  1746    PT Time Calculation (min)  56 min    Activity Tolerance  Patient tolerated treatment well    Behavior During Therapy  WFL for tasks assessed/performed       Past Medical History:  Diagnosis Date  . Arthritis   . Asthma    only URI  . Hyperlipidemia   . Hypertension     Past Surgical History:  Procedure Laterality Date  . ABDOMINAL HYSTERECTOMY    . CHOLECYSTECTOMY    . SHOULDER ARTHROSCOPY WITH ROTATOR CUFF REPAIR Right 06/11/2018   Procedure: SHOULDER ARTHROSCOPY WITH ROTATOR CUFF REPAIR;  Surgeon: Lovell Sheehan, MD;  Location: [facility] ORS;  Service: Orthopedics;  Laterality: Right;    There were no vitals filed for this visit.  Subjective Assessment - 10/07/18 1652    Subjective  R shoulder is doing good. No pain currently. Bothers her in certain ways she moves it. Bothers her when she raises her R arm up.     Pertinent History  S/P R rotator cuff surgery on 06/11/2018. Pt was lifting a bag of linnen at work last year (07/01/2017) which resulted in injury. Thought her shoulder would get better on its own but it got worse.  Had PT at [facility] which did not fix it.   Has not had any PT since her surgery. Was told to perform pendulums at home.  Forgot to use her abduction pillow for her sling.  Also feels pain at the top of her R shoulder blade.  Pt is R hand dominant.  No  falls within the last 6 months.  No fear of falling.     Currently in Pain?  Yes   none at rest   Pain Score  4    when raising her R arm up   Pain Onset  More than a month ago         [facility] PT Assessment - 10/07/18 0001      AROM   Right Shoulder Flexion  80 Degrees   with slight shoulder shrug   Right Shoulder ABduction  65 Degrees      Strength   Right Shoulder Internal Rotation  4/5    Right Shoulder External Rotation  4/5                           PT Education - 10/07/18 1748    Education Details  ther-ex    Person(s) Educated  Patient    Methods  Explanation;Demonstration;Tactile cues;Verbal cues    Comprehension  Returned demonstration;Verbalized understanding      Objectives  S/P 17weeks  No latex band allergies   MedbridgeAccess Code: [email or web address]     At start of session R shoulder flexion AROM in standing:  68 degrees,   scaption AROM: 65 degrees   Manual therapy    Supine with R arm in slight abduction, posterior and inferior glide R glenohumeral joint grade 3 to decrease stiffness  Supine with arm in flexion, STM to R teres major muscle area  Supine with arm in abduction STM to distal pectoralis major  Supine with caudal pressure to R humeral head             Flexion PROM 10x3             scaption PROM 10x3  supine R shoulder flexion AROM 146 degrees supein R shoulder scaption AROM 130 degrees  Therapeutic exercise  Supine R shoulder in 90 degrees abduction  ER 60 degrees  IR 72 degrees  Reclined R shoulder flexion AROM 10x2  Reclined R shoulder scaption AROM 10x  Reclined with R shoulder in 90 degrees flexion  Rhythmic stabilization 10 seconds x 10    Manually resisted R shoulder ER and IR 1x each way  Standing scapular retraction resisting yellow band 10x5 seconds for 2 sets  Reviewed and given as part of her HEP. Pt demonstrated and verbalized understanding.    Improved  exercise technique, movement at target joints, use of target muscles after min to mod verbal, visual, tactile cues.    Pt demonstrates R posterior and inferior R glenohumeral joint stiffness. Manual therapy to promote joint mobility as well as soft tissue techniques to help decrease tension to her teres major and distal pectoralis muscles helps improve flexion and scaption ROM. Pt overall slowly improving supine AROM and standing AROM. Standing AROM still difficult due to weakness. Shoulder ER and IR still 4/5 for both ways but feels like it might increase strength in a few weeks. Able to provide 3/5 resistance for shoulder flexion and 3+/5 for shoulder scaption at available range based on 09/19/18 measurements (unable to test at 90 degrees yet due to limited AROM against gravity in standing). Pt still demonstrates limited R shoulder AROM, weakness, joint stiffness, difficulty with scapular control (improving), and difficulty performing functional tasks and would benefit from continued skilled physical therapy services to address the aforementioned deficits.           PT Short Term Goals - 09/19/18 0906      PT SHORT TERM GOAL #1   Title  Patient will be independent with her HEP to promote ability to raise her R arm against gravity and use it for functional tasks when appropriate.     Time  3    Period  Weeks    Status  Achieved    Target Date  09/19/18        PT Long Term Goals - 10/07/18 1756      PT LONG TERM GOAL #1   Title  Patient will improve R shoulder flexion PROM to at least 90 degrees flexion and scaption/abduction to at least 90 degrees to promote ability to raise her R arm, use her R UE to perform functional tasks when appropriate.     Baseline  R shoulder PROM: 75 degrees flexion, 64 degrees abduction (06/25/2018); 90 degrees PROM for both flexion and scaption (07/17/2018); 140 degrees and 130 degrees (09/19/2018)    Time  6    Period  Weeks    Status  Achieved      PT  LONG TERM GOAL #2   Title  Patient will improve R shoulder ER PROM to at least 70 degrees and IR PROM to at  least 90 degrees to promote ability to use her R UE for self care as well as promote ability to don and doff clothes when appropriate.     Baseline  R shoulder PROM in scapular plane: 35 degrees ER, 72 degrees IR (06/25/2018); 54 degrees ER, 76 degrees IR (07/17/2018); 53 degrees ER AAROM, 60 degrees IR AAROM (08/06/2018); 63 degrees ER,  70 degrees IR AAROM (08/29/2018); 66 degrees ER,  60 degrees IR AAROM (09/19/2018);  60 degrees ER, 72 degrees IR AAROM (10/07/2018)    Time  6    Period  Weeks    Status  Partially Met    Target Date  10/31/18      PT LONG TERM GOAL #3   Title  Pt will improve her R shoulder FOTO score by at least 20 points as a demonstration of improved function.     Baseline  R shoulder FOTO 4 (06/25/2018); 45 (08/29/2018)    Time  6    Period  Weeks    Status  Achieved      PT LONG TERM GOAL #4   Title  Patient will improve R shoulder flexion and scaption AAROM to 130 degrees or more to promote ability to raise her R arm when appropriate.     Baseline  AAROM not yet performed secondary to PROM for first 6 weeks (07/17/2018); supine AAROM:  115 degrees flexion, 122 degrees scaption (08/06/2018), 121 degrees flexion, 130 degrees scaption (08/29/2018); 121 degrees flexion, 125 degrees scaption (09/19/2018); supine AROM: flexion 146 degrees, scaption 130 degrees (10/07/2018)     Time  6    Period  Weeks    Status  Achieved    Target Date  10/31/18      PT LONG TERM GOAL #5   Title  Patient will have at least 4/5 R shoulder flexion, scaption, ER and IR strength to promote ability to utilize her R UE to perform funcitonal tasks when appropriate.     Baseline  Strength not tested to allow for more healing (07/17/2018); Not yet tested (08/06/2018); R shoulder ER and IR at least 4-/5. Shoulder flexion and scaption not yet tested (not yet able to test at 90 degrees flexion or scaption)  08/29/2018); R shoulder ER and IR at least 4/5. Shoulder flexion and scaption tested in available range with lmited resistance (not yet able to test at 90 degrees flexion or scaption 09/19/2018)    Time  6    Period  Weeks    Status  On-going    Target Date  10/31/18      PT LONG TERM GOAL #6   Title  Pt will improve her R shoulder FOTO score to at least 60 points as a demonstration of improved function.     Baseline  45 (08/29/2018); 53 (09/19/2018)    Time  6    Period  Weeks    Status  Partially Met    Target Date  10/31/18            Plan - 10/07/18 1747    Clinical Impression Statement  Pt demonstrates R posterior and inferior R glenohumeral joint stiffness. Manual therapy to promote joint mobility as well as soft tissue techniques to help decrease tension to her teres major and distal pectoralis muscles helps improve flexion and scaption ROM. Pt overall slowly improving supine AROM and standing AROM. Standing AROM still difficult due to weakness. Shoulder ER and IR still 4/5 for both ways but feels like it might increase  strength in a few weeks. Able to provide 3/5 resistance for shoulder flexion and 3+/5 for shoulder scaption at available range based on 09/19/18 measurements (unable to test at 90 degrees yet due to limited AROM against gravity in standing). Pt still demonstrates limited R shoulder AROM, weakness, joint stiffness, difficulty with scapular control (improving), and difficulty performing functional tasks and would benefit from continued skilled physical therapy services to address the aforementioned deficits.     History and Personal Factors relevant to plan of care:  Chronicity of condition prior to surgery, pain, weakness, TTP, difficulty reaching, using her R UE for self care, donning and diffing clothes.     Clinical Presentation  Stable    Clinical Presentation due to:  Improving ROM, overall strength, and ability to use affected arm since eval    Clinical Decision  Making  Low    Rehab Potential  Fair    Clinical Impairments Affecting Rehab Potential  (-) age, chronicity of condition prior to surgery, healing time; (+) motivated    PT Frequency  2x / week    PT Duration  6 weeks    PT Treatment/Interventions  Manual techniques;Neuromuscular re-education;Therapeutic activities;Therapeutic exercise;Patient/family education;Passive range of motion;Dry needling;Aquatic Therapy;Electrical Stimulation;Iontophoresis '4mg'$ /ml Dexamethasone;Ultrasound    PT Next Visit Plan   Can follow [place] rotator cuff protocol per Carlynn Spry PA-C    Consulted and Agree with Plan of Care  Patient       Patient will benefit from skilled therapeutic intervention in order to improve the following deficits and impairments:  Pain, Postural dysfunction, Improper body mechanics, Impaired UE functional use, Decreased strength, Decreased range of motion  Visit Diagnosis: Right shoulder pain, unspecified chronicity  Muscle weakness (generalized)     Problem List There are no active problems to display for this patient.   Thank you for your referral.  Joneen Boers PT, DPT   10/07/2018, 6:12 PM  [facility] [address] [place], [address] Phone: [phone]   Fax:  [phone]  Name: Alyssa Baird MRN: [id number] Date of Birth: 1946/08/29

## 2018-10-07 NOTE — Patient Instructions (Signed)
MedbridgeAccess Code: [email or web address]   Supine Shoulder Flexion Extension Full Range AROM              And scaption               10x2 with elbow bent for each position daily.     Can be performed on her recliner.

## 2018-10-09 ENCOUNTER — Ambulatory Visit: Payer: PRIVATE HEALTH INSURANCE

## 2018-10-10 ENCOUNTER — Ambulatory Visit: Payer: PRIVATE HEALTH INSURANCE

## 2018-10-10 DIAGNOSIS — M25511 Pain in right shoulder: Secondary | ICD-10-CM

## 2018-10-10 DIAGNOSIS — M6281 Muscle weakness (generalized): Secondary | ICD-10-CM

## 2018-10-10 NOTE — Patient Instructions (Addendum)
  Hand slides up the wall   Facing the wall with your hand inside a pillow case   Keep your shoulder blades back   Slide your hand forward up the wall    Repeat 5 times    Perform 3 sets daily.

## 2018-10-10 NOTE — Therapy (Signed)
[facility] [address], [place], [address] Phone: [phone]   Fax:  [phone]  Physical Therapy Treatment  Patient Details  Name: Alyssa Baird MRN: [id number] Date of Birth: 09-09-1946 Referring Provider (PT): Kurtis Bushman, MD   Encounter Date: 10/10/2018  PT End of Session - 10/10/18 1622    Visit Number  27    Number of Visits  32    Date for PT Re-Evaluation  10/31/18    Authorization Type  27    Authorization Time Period  of 28 worker's comp    PT Start Time  1622    PT Stop Time  1707    PT Time Calculation (min)  45 min    Activity Tolerance  Patient tolerated treatment well    Behavior During Therapy  WFL for tasks assessed/performed       Past Medical History:  Diagnosis Date  . Arthritis   . Asthma    only URI  . Hyperlipidemia   . Hypertension     Past Surgical History:  Procedure Laterality Date  . ABDOMINAL HYSTERECTOMY    . CHOLECYSTECTOMY    . SHOULDER ARTHROSCOPY WITH ROTATOR CUFF REPAIR Right 06/11/2018   Procedure: SHOULDER ARTHROSCOPY WITH ROTATOR CUFF REPAIR;  Surgeon: Lovell Sheehan, MD;  Location: [facility];  Service: Orthopedics;  Laterality: Right;    There were no vitals filed for this visit.  Subjective Assessment - 10/10/18 1624    Subjective  Pt states that her Worker's Comp nurse wants a copy of her progress report Alyssa Baird, Therapist, sports). R shoulder is feeling better. Was a little sore after last session.  Her doctor's appointment was pretty good.  Doctor wants more therapy for her shoulder. Nurse said that it might be too early to go back full time at her job.     Pertinent History  S/P R rotator cuff surgery on 06/11/2018. Pt was lifting a bag of linnen at work last year (07/01/2017) which resulted in injury. Thought her shoulder would get better on its own but it got worse.  Had PT at [facility] which did not fix it.   Has not had any PT since her surgery. Was  told to perform pendulums at home.  Forgot to use her abduction pillow for her sling.  Also feels pain at the top of her R shoulder blade.  Pt is R hand dominant.  No falls within the last 6 months.  No fear of falling.     Currently in Pain?  No/denies    Pain Score  0-No pain    Pain Onset  More than a month ago                               PT Education - 10/10/18 1655    Education Details  ther-ex, HEP    Person(s) Educated  Patient    Methods  Explanation;Demonstration;Tactile cues;Verbal cues;Handout    Comprehension  Returned demonstration;Verbalized understanding      Objectives  S/P 17weeks  No latex band allergies  Next MD appointment is 11/21/2018   MedbridgeAccess Code: [email or web address]      At start of session R shoulder flexion AROM in standing: 68 degrees,              scaption AROM: 54 degrees   Manual therapy   Supine with R arm in slight abduction, inferior  glide R glenohumeral joint grade 3 to decrease stiffness  Supine with caudal pressure to R humeral head Flexion PROM 10x3 scaption PROM 10x3   Improved mobility and ROM afterwards    Therapeutic exercise  Reclined R shoulder flexion AROM 10x2 arms straight  Reclined R shoulder scaption AROM 10x with PT assist   Reclined with R shoulder in 90 degrees flexion             Rhythmic stabilization 10 seconds x 10   Standing R shoulder extension resisting yellow band 10x  Then red band 10x   Then 10x5 seconds  Pillow case slide up the wall  Flexion 5x2    scaption 5x (discomfort)   Improved exercise technique, movement at target joints, use of target muscles after mod verbal, visual, tactile cues.    Continued working on manual therapy to promote R glenohumeral joint mobility, followed by utilizing anterior shoulder muscles to perform flexion and scaption against gravitational resistance with emphasis on scapular  retraction as well to promote better glenohumeral joint mechanics. Pt able to achieve 115 degrees flexion AROM with slight shoulder shrug in standing after session. Pt will benefit from continued skilled physical therapy services to promote ROM, strength, and function.     PT Short Term Goals - 09/19/18 0906      PT SHORT TERM GOAL #1   Title  Patient will be independent with her HEP to promote ability to raise her R arm against gravity and use it for functional tasks when appropriate.     Time  3    Period  Weeks    Status  Achieved    Target Date  09/19/18        PT Long Term Goals - 10/07/18 1756      PT LONG TERM GOAL #1   Title  Patient will improve R shoulder flexion PROM to at least 90 degrees flexion and scaption/abduction to at least 90 degrees to promote ability to raise her R arm, use her R UE to perform functional tasks when appropriate.     Baseline  R shoulder PROM: 75 degrees flexion, 64 degrees abduction (06/25/2018); 90 degrees PROM for both flexion and scaption (07/17/2018); 140 degrees and 130 degrees (09/19/2018)    Time  6    Period  Weeks    Status  Achieved      PT LONG TERM GOAL #2   Title  Patient will improve R shoulder ER PROM to at least 70 degrees and IR PROM to at least 90 degrees to promote ability to use her R UE for self care as well as promote ability to don and doff clothes when appropriate.     Baseline  R shoulder PROM in scapular plane: 35 degrees ER, 72 degrees IR (06/25/2018); 54 degrees ER, 76 degrees IR (07/17/2018); 53 degrees ER AAROM, 60 degrees IR AAROM (08/06/2018); 63 degrees ER,  70 degrees IR AAROM (08/29/2018); 66 degrees ER,  60 degrees IR AAROM (09/19/2018);  60 degrees ER, 72 degrees IR AAROM (10/07/2018)    Time  6    Period  Weeks    Status  Partially Met    Target Date  10/31/18      PT LONG TERM GOAL #3   Title  Pt will improve her R shoulder FOTO score by at least 20 points as a demonstration of improved function.     Baseline  R  shoulder FOTO 4 (06/25/2018); 45 (08/29/2018)    Time  6  Period  Weeks    Status  Achieved      PT LONG TERM GOAL #4   Title  Patient will improve R shoulder flexion and scaption AAROM to 130 degrees or more to promote ability to raise her R arm when appropriate.     Baseline  AAROM not yet performed secondary to PROM for first 6 weeks (07/17/2018); supine AAROM:  115 degrees flexion, 122 degrees scaption (08/06/2018), 121 degrees flexion, 130 degrees scaption (08/29/2018); 121 degrees flexion, 125 degrees scaption (09/19/2018); supine AROM: flexion 146 degrees, scaption 130 degrees (10/07/2018)     Time  6    Period  Weeks    Status  Achieved    Target Date  10/31/18      PT LONG TERM GOAL #5   Title  Patient will have at least 4/5 R shoulder flexion, scaption, ER and IR strength to promote ability to utilize her R UE to perform funcitonal tasks when appropriate.     Baseline  Strength not tested to allow for more healing (07/17/2018); Not yet tested (08/06/2018); R shoulder ER and IR at least 4-/5. Shoulder flexion and scaption not yet tested (not yet able to test at 90 degrees flexion or scaption) 08/29/2018); R shoulder ER and IR at least 4/5. Shoulder flexion and scaption tested in available range with lmited resistance (not yet able to test at 90 degrees flexion or scaption 09/19/2018)    Time  6    Period  Weeks    Status  On-going    Target Date  10/31/18      PT LONG TERM GOAL #6   Title  Pt will improve her R shoulder FOTO score to at least 60 points as a demonstration of improved function.     Baseline  45 (08/29/2018); 53 (09/19/2018)    Time  6    Period  Weeks    Status  Partially Met    Target Date  10/31/18            Plan - 10/10/18 1717    Clinical Impression Statement  Continued working on manual therapy to promote R glenohumeral joint mobility, followed by utilizing anterior shoulder muscles to perform flexion and scaption against gravitational resistance with emphasis  on scapular retraction as well to promote better glenohumeral joint mechanics. Pt able to achieve 115 degrees flexion AROM with slight shoulder shrug in standing after session. Pt will benefit from continued skilled physical therapy services to promote ROM, strength, and function.     Rehab Potential  Fair    Clinical Impairments Affecting Rehab Potential  (-) age, chronicity of condition prior to surgery, healing time; (+) motivated    PT Frequency  2x / week    PT Duration  6 weeks    PT Treatment/Interventions  Manual techniques;Neuromuscular re-education;Therapeutic activities;Therapeutic exercise;Patient/family education;Passive range of motion;Dry needling;Aquatic Therapy;Electrical Stimulation;Iontophoresis 20m/ml Dexamethasone;Ultrasound    PT Next Visit Plan   Can follow [facility] cuff protocol per MCarlynn SpryPA-C    Consulted and Agree with Plan of Care  Patient       Patient will benefit from skilled therapeutic intervention in order to improve the following deficits and impairments:  Pain, Postural dysfunction, Improper body mechanics, Impaired UE functional use, Decreased strength, Decreased range of motion  Visit Diagnosis: Right shoulder pain, unspecified chronicity  Muscle weakness (generalized)     Problem List There are no active problems to display for this patient.  MJoneen BoersPT, DPT  10/10/2018, 5:20 PM  [facility] [address] [place], [address] Phone: [phone]   Fax:  [phone]  Name: Alyssa Baird MRN: [id number] Date of Birth: 01-29-1946

## 2018-10-14 ENCOUNTER — Ambulatory Visit: Payer: PRIVATE HEALTH INSURANCE

## 2018-10-14 DIAGNOSIS — M6281 Muscle weakness (generalized): Secondary | ICD-10-CM

## 2018-10-14 DIAGNOSIS — M25511 Pain in right shoulder: Secondary | ICD-10-CM | POA: Diagnosis not present

## 2018-10-14 NOTE — Therapy (Signed)
[facility] [address], [place], [address] Phone: [phone]   Fax:  [phone]  Physical Therapy Treatment  Patient Details  Name: Alyssa Baird MRN: [id number] Date of Birth: July 20, 1946 Referring Provider (PT): Kurtis Bushman, MD   Encounter Date: 10/14/2018  PT End of Session - 10/14/18 1608    Visit Number  28    Number of Visits  32    Date for PT Re-Evaluation  10/31/18    Authorization Type  28    Authorization Time Period  of 71 worker's comp    PT Start Time  1608    PT Stop Time  1649    PT Time Calculation (min)  41 min    Activity Tolerance  Patient tolerated treatment well    Behavior During Therapy  WFL for tasks assessed/performed       Past Medical History:  Diagnosis Date  . Arthritis   . Asthma    only URI  . Hyperlipidemia   . Hypertension     Past Surgical History:  Procedure Laterality Date  . ABDOMINAL HYSTERECTOMY    . CHOLECYSTECTOMY    . SHOULDER ARTHROSCOPY WITH ROTATOR CUFF REPAIR Right 06/11/2018   Procedure: SHOULDER ARTHROSCOPY WITH ROTATOR CUFF REPAIR;  Surgeon: Lovell Sheehan, MD;  Location: [facility] ORS;  Service: Orthopedics;  Laterality: Right;    There were no vitals filed for this visit.  Subjective Assessment - 10/14/18 1609    Subjective  R shoulder feels stiff. Feels sore as well. 4/10 currently after raising her R arm up. R shoulder usually feels good when she leaves PT.     Pertinent History  S/P R rotator cuff surgery on 06/11/2018. Pt was lifting a bag of linnen at work last year (07/01/2017) which resulted in injury. Thought her shoulder would get better on its own but it got worse.  Had PT at [facility] which did not fix it.   Has not had any PT since her surgery. Was told to perform pendulums at home.  Forgot to use her abduction pillow for her sling.  Also feels pain at the top of her R shoulder blade.  Pt is R hand dominant.  No falls within the  last 6 months.  No fear of falling.     Currently in Pain?  Yes    Pain Score  4     Pain Onset  More than a month ago                               PT Education - 10/14/18 1629    Education Details  ther-ex    Person(s) Educated  Patient    Methods  Explanation;Demonstration;Tactile cues;Verbal cues    Comprehension  Returned demonstration;Verbalized understanding      Objectives  S/P18weeks  No latex band allergies  Next MD appointment is 11/21/2018   MedbridgeAccess Code: [email or web address]      At start of session R shoulder flexion AROM in standing: 64 degrees,  scaption AROM: 59 degrees   Manual therapy   Supine with R arm in slight abduction,inferiorglide R glenohumeral joint grade 3 to decrease stiffness  Supine with caudal pressure to R humeral head Flexion PROM 10x3 scaption PROM 10x3   Improved mobility and ROM afterwards    Therapeutic exercise  Reclined (almost upright) R shoulder flexion AROM 10x2 arms straight  Reclined (almost upright)  R shoulder scaption AROM 10x with PT assist   Reclined (almost upright) with R shoulder in 90 degrees flexion Rhythmic stabilization 10 seconds x 10   OMEGA scapular retraction plate 5 for 5x3   Pillow case slide up the wall             Flexion 5x2               scaption 5x (discomfort, eases with rest)   R shoulder AROM: 120 degrees flexion, 59 degrees scaption    UE ranger on wall  scaption 5x3  62 degrees scaption AROM afterwards    Improved exercise technique, movement at target joints, use of target muscles after min to mod verbal, visual, tactile cues.    Continued working on joint mobilizations to help decrease stiffness followed by AROM to promote strength in raising her R arm up throughout range and to maintain mobility. Improved R shoulder flexion AROM. Difficulty with scaption  today.    PT Short Term Goals - 09/19/18 0906      PT SHORT TERM GOAL #1   Title  Patient will be independent with her HEP to promote ability to raise her R arm against gravity and use it for functional tasks when appropriate.     Time  3    Period  Weeks    Status  Achieved    Target Date  09/19/18        PT Long Term Goals - 10/07/18 1756      PT LONG TERM GOAL #1   Title  Patient will improve R shoulder flexion PROM to at least 90 degrees flexion and scaption/abduction to at least 90 degrees to promote ability to raise her R arm, use her R UE to perform functional tasks when appropriate.     Baseline  R shoulder PROM: 75 degrees flexion, 64 degrees abduction (06/25/2018); 90 degrees PROM for both flexion and scaption (07/17/2018); 140 degrees and 130 degrees (09/19/2018)    Time  6    Period  Weeks    Status  Achieved      PT LONG TERM GOAL #2   Title  Patient will improve R shoulder ER PROM to at least 70 degrees and IR PROM to at least 90 degrees to promote ability to use her R UE for self care as well as promote ability to don and doff clothes when appropriate.     Baseline  R shoulder PROM in scapular plane: 35 degrees ER, 72 degrees IR (06/25/2018); 54 degrees ER, 76 degrees IR (07/17/2018); 53 degrees ER AAROM, 60 degrees IR AAROM (08/06/2018); 63 degrees ER,  70 degrees IR AAROM (08/29/2018); 66 degrees ER,  60 degrees IR AAROM (09/19/2018);  60 degrees ER, 72 degrees IR AAROM (10/07/2018)    Time  6    Period  Weeks    Status  Partially Met    Target Date  10/31/18      PT LONG TERM GOAL #3   Title  Pt will improve her R shoulder FOTO score by at least 20 points as a demonstration of improved function.     Baseline  R shoulder FOTO 4 (06/25/2018); 45 (08/29/2018)    Time  6    Period  Weeks    Status  Achieved      PT LONG TERM GOAL #4   Title  Patient will improve R shoulder flexion and scaption AAROM to 130 degrees or more to promote ability to raise her R  arm when  appropriate.     Baseline  AAROM not yet performed secondary to PROM for first 6 weeks (07/17/2018); supine AAROM:  115 degrees flexion, 122 degrees scaption (08/06/2018), 121 degrees flexion, 130 degrees scaption (08/29/2018); 121 degrees flexion, 125 degrees scaption (09/19/2018); supine AROM: flexion 146 degrees, scaption 130 degrees (10/07/2018)     Time  6    Period  Weeks    Status  Achieved    Target Date  10/31/18      PT LONG TERM GOAL #5   Title  Patient will have at least 4/5 R shoulder flexion, scaption, ER and IR strength to promote ability to utilize her R UE to perform funcitonal tasks when appropriate.     Baseline  Strength not tested to allow for more healing (07/17/2018); Not yet tested (08/06/2018); R shoulder ER and IR at least 4-/5. Shoulder flexion and scaption not yet tested (not yet able to test at 90 degrees flexion or scaption) 08/29/2018); R shoulder ER and IR at least 4/5. Shoulder flexion and scaption tested in available range with lmited resistance (not yet able to test at 90 degrees flexion or scaption 09/19/2018)    Time  6    Period  Weeks    Status  On-going    Target Date  10/31/18      PT LONG TERM GOAL #6   Title  Pt will improve her R shoulder FOTO score to at least 60 points as a demonstration of improved function.     Baseline  45 (08/29/2018); 53 (09/19/2018)    Time  6    Period  Weeks    Status  Partially Met    Target Date  10/31/18            Plan - 10/14/18 1629    Clinical Impression Statement  Continued working on joint mobilizations to help decrease stiffness followed by AROM to promote strength in raising her R arm up throughout range and to maintain mobility. Improved R shoulder flexion AROM. Difficulty with scaption today.     Rehab Potential  Fair    Clinical Impairments Affecting Rehab Potential  (-) age, chronicity of condition prior to surgery, healing time; (+) motivated    PT Frequency  2x / week    PT Duration  6 weeks    PT  Treatment/Interventions  Manual techniques;Neuromuscular re-education;Therapeutic activities;Therapeutic exercise;Patient/family education;Passive range of motion;Dry needling;Aquatic Therapy;Electrical Stimulation;Iontophoresis 48m/ml Dexamethasone;Ultrasound    PT Next Visit Plan   Can follow MRowanrotator cuff protocol per MCarlynn SpryPA-C    Consulted and Agree with Plan of Care  Patient       Patient will benefit from skilled therapeutic intervention in order to improve the following deficits and impairments:  Pain, Postural dysfunction, Improper body mechanics, Impaired UE functional use, Decreased strength, Decreased range of motion  Visit Diagnosis: Right shoulder pain, unspecified chronicity  Muscle weakness (generalized)     Problem List There are no active problems to display for this patient.  MJoneen BoersPT, DPT   10/14/2018, 8:29 PM  [facility] [address] [place] [address]Phone: [phone]  Fax:  [phone] Name: Alyssa ENTWISTLEMRN: [id number] of Birth: [birth year]

## 2018-10-16 ENCOUNTER — Ambulatory Visit: Payer: PRIVATE HEALTH INSURANCE

## 2018-10-21 ENCOUNTER — Ambulatory Visit: Payer: PRIVATE HEALTH INSURANCE

## 2018-10-21 DIAGNOSIS — M25511 Pain in right shoulder: Secondary | ICD-10-CM

## 2018-10-21 DIAGNOSIS — M6281 Muscle weakness (generalized): Secondary | ICD-10-CM

## 2018-10-21 NOTE — Therapy (Signed)
[facility] [address] [place], [address] Phone: [phone]   Fax:  [phone]  Physical Therapy Treatment  Patient Details  Name: Alyssa Baird MRN: [id number] Date of Birth: Sep 24, 1946 Referring Provider (PT): Kurtis Bushman, MD   Encounter Date: 10/21/2018  PT End of Session - 10/21/18 1533    Visit Number  29    Number of Visits  32    Date for PT Re-Evaluation  10/31/18    Authorization Type  29    Authorization Time Period  of 74 worker's comp    PT Start Time  1533    PT Stop Time  1614    PT Time Calculation (min)  41 min    Activity Tolerance  Patient tolerated treatment well    Behavior During Therapy  WFL for tasks assessed/performed       Past Medical History:  Diagnosis Date  . Arthritis   . Asthma    only URI  . Hyperlipidemia   . Hypertension     Past Surgical History:  Procedure Laterality Date  . ABDOMINAL HYSTERECTOMY    . CHOLECYSTECTOMY    . SHOULDER ARTHROSCOPY WITH ROTATOR CUFF REPAIR Right 06/11/2018   Procedure: SHOULDER ARTHROSCOPY WITH ROTATOR CUFF REPAIR;  Surgeon: Lovell Sheehan, MD;  Location: [facility];  Service: Orthopedics;  Laterality: Right;    There were no vitals filed for this visit.  Subjective Assessment - 10/21/18 1534    Subjective  Pt states that she prefers not to lie down due to her sinuses. R shoulder feels a little sore with this weather. Not too bad. 3/10 currently.     Pertinent History  S/P R rotator cuff surgery on 06/11/2018. Pt was lifting a bag of linnen at work last year (07/01/2017) which resulted in injury. Thought her shoulder would get better on its own but it got worse.  Had PT at [facility] which did not fix it.   Has not had any PT since her surgery. Was told to perform pendulums at home.  Forgot to use her abduction pillow for her sling.  Also feels pain at the top of her R shoulder blade.  Pt is R hand dominant.  No falls within  the last 6 months.  No fear of falling.     Currently in Pain?  Yes    Pain Score  3     Pain Onset  More than a month ago                               PT Education - 10/21/18 1540    Education Details  ther-ex    [employer]) Educated  Patient    Methods  Explanation;Demonstration;Tactile cues;Verbal cues    Comprehension  Returned demonstration;Verbalized understanding        Objectives  S/P18- 19 weeks  No latex band allergies  Next MD appointment is 11/21/2018   MedbridgeAccess Code: [email or web address]      At start of session R shoulder flexion AROM in standing: 126 degrees,  scaption AROM:59degrees    Therapeutic exercise  Seated L shoulder abduction physioball rolls 10x5 seconds. Cues to keep shoulder blade down.   Seated ball rolls flexion 10x5 seconds for 2 sets  Then lateral for scaption 10x5 seconds for 2 sets   Towel/pillow case slide scaption up the wall 10x  127 degrees R shoulder scaption AAROM using  wall  118 degrees R shoulder scaption AROM in standing afterwards.   Towel/pillow case slide scaption with small step away 6x  Towel/pillow case slide flexion 5x   OMEGA scapular retraction plate 5 for 5x3  Pillow case assisted R shoulder IR 5x5 seconds for 2 sets   Standing R shoulder extension resisting yellow band 10x3 with scapular retraction   Standing with R arm in about 80 degrees flexion  Rhythmic stabilization 5 seconds x 3    UE ranger on wall             scaption 10x, then 5x             Improved exercise technique, movement at target joints, use of target muscles after mod verbal, visual, tactile cues.   Pt improving R shoulder flexion AROM with pt able to achieve 126 degrees flexion in standing at start of session. Able to achieve 118 degrees scaption AROM following AAROM exercise.              PT Short Term Goals - 09/19/18 0906      PT SHORT TERM  GOAL #1   Title  Patient will be independent with her HEP to promote ability to raise her R arm against gravity and use it for functional tasks when appropriate.     Time  3    Period  Weeks    Status  Achieved    Target Date  09/19/18        PT Long Term Goals - 10/07/18 1756      PT LONG TERM GOAL #1   Title  Patient will improve R shoulder flexion PROM to at least 90 degrees flexion and scaption/abduction to at least 90 degrees to promote ability to raise her R arm, use her R UE to perform functional tasks when appropriate.     Baseline  R shoulder PROM: 75 degrees flexion, 64 degrees abduction (06/25/2018); 90 degrees PROM for both flexion and scaption (07/17/2018); 140 degrees and 130 degrees (09/19/2018)    Time  6    Period  Weeks    Status  Achieved      PT LONG TERM GOAL #2   Title  Patient will improve R shoulder ER PROM to at least 70 degrees and IR PROM to at least 90 degrees to promote ability to use her R UE for self care as well as promote ability to don and doff clothes when appropriate.     Baseline  R shoulder PROM in scapular plane: 35 degrees ER, 72 degrees IR (06/25/2018); 54 degrees ER, 76 degrees IR (07/17/2018); 53 degrees ER AAROM, 60 degrees IR AAROM (08/06/2018); 63 degrees ER,  70 degrees IR AAROM (08/29/2018); 66 degrees ER,  60 degrees IR AAROM (09/19/2018);  60 degrees ER, 72 degrees IR AAROM (10/07/2018)    Time  6    Period  Weeks    Status  Partially Met    Target Date  10/31/18      PT LONG TERM GOAL #3   Title  Pt will improve her R shoulder FOTO score by at least 20 points as a demonstration of improved function.     Baseline  R shoulder FOTO 4 (06/25/2018); 45 (08/29/2018)    Time  6    Period  Weeks    Status  Achieved      PT LONG TERM GOAL #4   Title  Patient will improve R shoulder flexion and scaption AAROM to 130 degrees  or more to promote ability to raise her R arm when appropriate.     Baseline  AAROM not yet performed secondary to PROM for  first 6 weeks (07/17/2018); supine AAROM:  115 degrees flexion, 122 degrees scaption (08/06/2018), 121 degrees flexion, 130 degrees scaption (08/29/2018); 121 degrees flexion, 125 degrees scaption (09/19/2018); supine AROM: flexion 146 degrees, scaption 130 degrees (10/07/2018)     Time  6    Period  Weeks    Status  Achieved    Target Date  10/31/18      PT LONG TERM GOAL #5   Title  Patient will have at least 4/5 R shoulder flexion, scaption, ER and IR strength to promote ability to utilize her R UE to perform funcitonal tasks when appropriate.     Baseline  Strength not tested to allow for more healing (07/17/2018); Not yet tested (08/06/2018); R shoulder ER and IR at least 4-/5. Shoulder flexion and scaption not yet tested (not yet able to test at 90 degrees flexion or scaption) 08/29/2018); R shoulder ER and IR at least 4/5. Shoulder flexion and scaption tested in available range with lmited resistance (not yet able to test at 90 degrees flexion or scaption 09/19/2018)    Time  6    Period  Weeks    Status  On-going    Target Date  10/31/18      PT LONG TERM GOAL #6   Title  Pt will improve her R shoulder FOTO score to at least 60 points as a demonstration of improved function.     Baseline  45 (08/29/2018); 53 (09/19/2018)    Time  6    Period  Weeks    Status  Partially Met    Target Date  10/31/18            Plan - 10/21/18 1540    Clinical Impression Statement  Pt improving R shoulder flexion AROM with pt able to achieve 126 degrees flexion in standing at start of session. Able to achieve 118 degrees scaption AROM following AAROM exercise.     Rehab Potential  Fair    Clinical Impairments Affecting Rehab Potential  (-) age, chronicity of condition prior to surgery, healing time; (+) motivated    PT Frequency  2x / week    PT Duration  6 weeks    PT Treatment/Interventions  Manual techniques;Neuromuscular re-education;Therapeutic activities;Therapeutic exercise;Patient/family  education;Passive range of motion;Dry needling;Aquatic Therapy;Electrical Stimulation;Iontophoresis 74m/ml Dexamethasone;Ultrasound    PT Next Visit Plan   Can follow MLaytonsvillerotator cuff protocol per MCarlynn SpryPA-C    Consulted and Agree with Plan of Care  Patient       Patient will benefit from skilled therapeutic intervention in order to improve the following deficits and impairments:  Pain, Postural dysfunction, Improper body mechanics, Impaired UE functional use, Decreased strength, Decreased range of motion  Visit Diagnosis: Right shoulder pain, unspecified chronicity  Muscle weakness (generalized)     Problem List There are no active problems to display for this patient.   MJoneen BoersPT, DPT   10/21/2018, 4:24 PM  [facility] [address] [place] [address]Phone: [phone]  Fax:  [phone] Name: Alyssa SANABIAMRN: [id number] of Birth: 129-Nov-1947

## 2018-10-23 ENCOUNTER — Ambulatory Visit: Payer: PRIVATE HEALTH INSURANCE

## 2018-10-23 DIAGNOSIS — M6281 Muscle weakness (generalized): Secondary | ICD-10-CM

## 2018-10-23 DIAGNOSIS — M25511 Pain in right shoulder: Secondary | ICD-10-CM

## 2018-10-23 NOTE — Patient Instructions (Signed)
Pt was recommended to perform her exercises at home in a crepitus free range of motion. Pt verbalized understanding.

## 2018-10-23 NOTE — Therapy (Signed)
[facility] [address], [place], [address] Phone: [phone]   Fax:  [phone]  Physical Therapy Treatment  Patient Details  Name: Alyssa Baird MRN: [id number] Date of Birth: 1946-07-28 Referring Provider (PT): Kurtis Bushman, MD   Encounter Date: 10/23/2018  PT End of Session - 10/23/18 0850    Visit Number  30    Number of Visits  32    Date for PT Re-Evaluation  10/31/18    Authorization Type  30    Authorization Time Period  of 21 worker's comp    PT Start Time  [phone]    PT Stop Time  [phone]    PT Time Calculation (min)  46 min    Activity Tolerance  Patient tolerated treatment well    Behavior During Therapy  [facility] for tasks assessed/performed       Past Medical History:  Diagnosis Date  . Arthritis   . Asthma    only URI  . Hyperlipidemia   . Hypertension     Past Surgical History:  Procedure Laterality Date  . ABDOMINAL HYSTERECTOMY    . CHOLECYSTECTOMY    . SHOULDER ARTHROSCOPY WITH ROTATOR CUFF REPAIR Right 06/11/2018   Procedure: SHOULDER ARTHROSCOPY WITH ROTATOR CUFF REPAIR;  Surgeon: Lovell Sheehan, MD;  Location: [facility] ORS;  Service: Orthopedics;  Laterality: Right;    There were no vitals filed for this visit.  Subjective Assessment - 10/23/18 0851    Subjective  Feels grinding her R arm when she lifts it up. No pain currently. 8/10 when raising her arm up to the side.    Pertinent History  S/P R rotator cuff surgery on 06/11/2018. Pt was lifting a bag of linnen at work last year (07/01/2017) which resulted in injury. Thought her shoulder would get better on its own but it got worse.  Had PT at [facility] which did not fix it.   Has not had any PT since her surgery. Was told to perform pendulums at home.  Forgot to use her abduction pillow for her sling.  Also feels pain at the top of her R shoulder blade.  Pt is R hand dominant.  No falls within the last 6 months.  No fear of  falling.     Currently in Pain?  Yes    Pain Score  8    when raising her arm up   Pain Onset  More than a month ago                               PT Education - 10/23/18 0918    Education Details  ther-ex    Person(s) Educated  Patient    Methods  Explanation;Demonstration;Tactile cues;Verbal cues    Comprehension  Verbalized understanding;Returned demonstration        Objectives  S/P 19 weeks  No latex band allergies  Next MD appointment is 11/21/2018   MedbridgeAccess Code: [email or web address]      At start of session R shoulder flexion AROM in standing: 83 degrees,  scaption AROM:65degrees  Manual therapy  Supine with R arm in abduction  Inferior glide grade 3 to decrease stiffness  With inferior glide to R glenohumeral joint  Flexion 10x3  abuction 10x3   Therapeutic exercise  Supine rhythmic stabilization with arm at 90 degrees flexion   10x10 seconds  Towel/pillow case slides  Flexion 10x2. 93 degrees  flexion afterwards  scaption 5x3. 62 degrees scaption.   Standing B shoulder ER resisting yellow band 10x2  Scapular retraction resisting red band   10x5 seconds for 2 sets  Supine R shoulder flexion AROM with arm straight 10x Supine R shoulder scaption AROM with arm straight to 90 degrees 10x   Improved exercise technique, movement at target joints, use of target muscles after mod verbal, visual, tactile cues.    Crepitus heard and palpated when pt performs abduction. Continued working on joint mobility to promote inferior glide to promote better glenohumeral movement. Difficulty with raising her R arm up against gravity today compared to previous visit.       PT Short Term Goals - 09/19/18 0906      PT SHORT TERM GOAL #1   Title  Patient will be independent with her HEP to promote ability to raise her R arm against gravity and use it for functional tasks when appropriate.     Time  3     Period  Weeks    Status  Achieved    Target Date  09/19/18        PT Long Term Goals - 10/07/18 1756      PT LONG TERM GOAL #1   Title  Patient will improve R shoulder flexion PROM to at least 90 degrees flexion and scaption/abduction to at least 90 degrees to promote ability to raise her R arm, use her R UE to perform functional tasks when appropriate.     Baseline  R shoulder PROM: 75 degrees flexion, 64 degrees abduction (06/25/2018); 90 degrees PROM for both flexion and scaption (07/17/2018); 140 degrees and 130 degrees (09/19/2018)    Time  6    Period  Weeks    Status  Achieved      PT LONG TERM GOAL #2   Title  Patient will improve R shoulder ER PROM to at least 70 degrees and IR PROM to at least 90 degrees to promote ability to use her R UE for self care as well as promote ability to don and doff clothes when appropriate.     Baseline  R shoulder PROM in scapular plane: 35 degrees ER, 72 degrees IR (06/25/2018); 54 degrees ER, 76 degrees IR (07/17/2018); 53 degrees ER AAROM, 60 degrees IR AAROM (08/06/2018); 63 degrees ER,  70 degrees IR AAROM (08/29/2018); 66 degrees ER,  60 degrees IR AAROM (09/19/2018);  60 degrees ER, 72 degrees IR AAROM (10/07/2018)    Time  6    Period  Weeks    Status  Partially Met    Target Date  10/31/18      PT LONG TERM GOAL #3   Title  Pt will improve her R shoulder FOTO score by at least 20 points as a demonstration of improved function.     Baseline  R shoulder FOTO 4 (06/25/2018); 45 (08/29/2018)    Time  6    Period  Weeks    Status  Achieved      PT LONG TERM GOAL #4   Title  Patient will improve R shoulder flexion and scaption AAROM to 130 degrees or more to promote ability to raise her R arm when appropriate.     Baseline  AAROM not yet performed secondary to PROM for first 6 weeks (07/17/2018); supine AAROM:  115 degrees flexion, 122 degrees scaption (08/06/2018), 121 degrees flexion, 130 degrees scaption (08/29/2018); 121 degrees flexion, 125 degrees  scaption (09/19/2018); supine AROM: flexion 146 degrees,  scaption 130 degrees (10/07/2018)     Time  6    Period  Weeks    Status  Achieved    Target Date  10/31/18      PT LONG TERM GOAL #5   Title  Patient will have at least 4/5 R shoulder flexion, scaption, ER and IR strength to promote ability to utilize her R UE to perform funcitonal tasks when appropriate.     Baseline  Strength not tested to allow for more healing (07/17/2018); Not yet tested (08/06/2018); R shoulder ER and IR at least 4-/5. Shoulder flexion and scaption not yet tested (not yet able to test at 90 degrees flexion or scaption) 08/29/2018); R shoulder ER and IR at least 4/5. Shoulder flexion and scaption tested in available range with lmited resistance (not yet able to test at 90 degrees flexion or scaption 09/19/2018)    Time  6    Period  Weeks    Status  On-going    Target Date  10/31/18      PT LONG TERM GOAL #6   Title  Pt will improve her R shoulder FOTO score to at least 60 points as a demonstration of improved function.     Baseline  45 (08/29/2018); 53 (09/19/2018)    Time  6    Period  Weeks    Status  Partially Met    Target Date  10/31/18            Plan - 10/23/18 7915    Clinical Impression Statement  Crepitus heard and palpated when pt performs abduction. Continued working on joint mobility to promote inferior glide to promote better glenohumeral movement. Difficulty with raising her R arm up against gravity today compared to previous visit.     Rehab Potential  Fair    Clinical Impairments Affecting Rehab Potential  (-) age, chronicity of condition prior to surgery, healing time; (+) motivated    PT Frequency  2x / week    PT Duration  6 weeks    PT Treatment/Interventions  Manual techniques;Neuromuscular re-education;Therapeutic activities;Therapeutic exercise;Patient/family education;Passive range of motion;Dry needling;Aquatic Therapy;Electrical Stimulation;Iontophoresis '4mg'$ /ml  Dexamethasone;Ultrasound    PT Next Visit Plan   Can follow [place] rotator cuff protocol per Carlynn Spry PA-C    Consulted and Agree with Plan of Care  Patient       Patient will benefit from skilled therapeutic intervention in order to improve the following deficits and impairments:  Pain, Postural dysfunction, Improper body mechanics, Impaired UE functional use, Decreased strength, Decreased range of motion  Visit Diagnosis: Right shoulder pain, unspecified chronicity  Muscle weakness (generalized)     Problem List There are no active problems to display for this patient.   Joneen Boers PT, DPT   10/23/2018, 9:55 AM  [facility] [address], [place], [address] Phone: [phone]   Fax:  [phone]  Name: Alyssa Baird MRN: [id number] Date of Birth: 01-27-1946

## 2018-10-28 ENCOUNTER — Ambulatory Visit: Payer: PRIVATE HEALTH INSURANCE

## 2018-10-28 DIAGNOSIS — M25511 Pain in right shoulder: Secondary | ICD-10-CM | POA: Diagnosis not present

## 2018-10-28 DIAGNOSIS — M6281 Muscle weakness (generalized): Secondary | ICD-10-CM

## 2018-10-28 NOTE — Therapy (Signed)
[facility] [address], [place], [address] Phone: [phone]   Fax:  [phone]  Physical Therapy Treatment  Patient Details  Name: Alyssa Baird MRN: [id number] Date of Birth: 1946/07/05 Referring Provider (PT): Kurtis Bushman, MD   Encounter Date: 10/28/2018  PT End of Session - 10/28/18 1617    Visit Number  31    Number of Visits  44    Date for PT Re-Evaluation  12/12/18    Authorization Type  30    Authorization Time Period  of 61 worker's comp    PT Start Time  1618    PT Stop Time  1704    PT Time Calculation (min)  46 min    Activity Tolerance  Patient tolerated treatment well    Behavior During Therapy  [facility] for tasks assessed/performed       Past Medical History:  Diagnosis Date  . Arthritis   . Asthma    only URI  . Hyperlipidemia   . Hypertension     Past Surgical History:  Procedure Laterality Date  . ABDOMINAL HYSTERECTOMY    . CHOLECYSTECTOMY    . SHOULDER ARTHROSCOPY WITH ROTATOR CUFF REPAIR Right 06/11/2018   Procedure: SHOULDER ARTHROSCOPY WITH ROTATOR CUFF REPAIR;  Surgeon: Lovell Sheehan, MD;  Location: [facility] ORS;  Service: Orthopedics;  Laterality: Right;    There were no vitals filed for this visit.  Subjective Assessment - 10/28/18 1620    Subjective  R shoulder feels pretty good. Hears grinding in her R shoulder when she raises her arm. Pt also states feeling a crick in her neck. Pt states feeling grinding R shoulder when trying to actively raise her arm up (AROM exercises) since about 1 week ago. Feels shoulder cramping when she lifts something up that is about 10-15 lbs.     Pertinent History  S/P R rotator cuff surgery on 06/11/2018. Pt was lifting a bag of linnen at work last year (07/01/2017) which resulted in injury. Thought her shoulder would get better on its own but it got worse.  Had PT at [facility] which did not fix it.   Has not had any PT since her  surgery. Was told to perform pendulums at home.  Forgot to use her abduction pillow for her sling.  Also feels pain at the top of her R shoulder blade.  Pt is R hand dominant.  No falls within the last 6 months.  No fear of falling.     Currently in Pain?  No/denies    Pain Score  0-No pain    Pain Onset  More than a month ago         [facility] PT Assessment - 10/28/18 1634      Observation/Other Assessments   Focus on Therapeutic Outcomes (FOTO)   55      AROM   Right Shoulder Internal Rotation  71 Degrees   at 90 degrees abduction   Right Shoulder External Rotation  76 Degrees   at 90 degrees abduction     Strength   Right Shoulder Flexion  4-/5   at 90 degrees flexion   Right Shoulder ABduction  4-/5   at about 80 degrees scaption    Right Shoulder Internal Rotation  4/5    Right Shoulder External Rotation  4+/5  PT Education - 10/28/18 1715    Education Details  ther-ex, progress/current status with PT    Person(s) Educated  Patient    Methods  Explanation;Demonstration;Tactile cues;Verbal cues    Comprehension  Returned demonstration;Verbalized understanding        Objectives  S/P20 weeks  No latex band allergies  Next MD appointment is 11/21/2018   MedbridgeAccess Code: [email or web address]  At start of session R shoulder flexion AROM in standing: 115degrees,  scaption AROM:62degrees   Manual Therapy Seated STM R upper trap muscle   R shoulder AROM    scaption 80 degrees   flexion 115 degrees afterwards  Supine with R arm in 90 degrees abduction   Inferior glide grade 3  Posterior glide grade 3   To decrease stiffness   Therapeutic exercise  Standing manually resisted R shoulder flexion, scaption, ER, IR 1x each way for each direction  Reviewed progress/current status with strength with pt.   Supine R shoulder IR and ER at 90 degrees abduction  UE ranger  scaption 3x. Grinding  heard and palpated at shoulder   B shoulder ER resisting red band 10x3  AROM flexion 123 degrees  scaption 90 degrees    R shoulder IR resisitng red band 10x3  125 degrees R shoulder flexion AROM afterwards.    Try isometric R shoulder flexion next visit if appropriate.      Improved exercise technique, movement at target joints, use of target muscles after min to mod verbal, visual, tactile cues.   Pt demonstrates overall improving R shoulder flexion AROM with pt able to achieve about 125 degrees R shoulder flexion against gravity. Still demonstrates difficulty raising her R arm up in scaption/abduction with pt averaging about 60 degrees. Improved scaption AROM against gravity after manual therapy to decrease R upper trap tension as well as exercises to promote ER muscle use. Pt also demonstrates improved ER muscle strength since last measured. Pt also demonstrates crepitus in R shoulder with shoulder flexion and scaption however since about the past week per subjective reports. Slight decrease in symptoms following exercises to promote activation of her shoulder infraspinatus muscles. Pt was also recommended to use her L UE to help raise her R arm up when performing her ROM (AAROM) exercises instead of AROM and to perform them in a crepitus free range of motion. Also upgraded her bilateral shoulder ER exercise to a red band to promote infraspinatus muscle strength to promote glenohumeral control when raising her arm up. Pt verbalized understanding.  Pt will benefit from continued skilled physical therapy services to improve strength ROM, and function.        PT Short Term Goals - 09/19/18 0906      PT SHORT TERM GOAL #1   Title  Patient will be independent with her HEP to promote ability to raise her R arm against gravity and use it for functional tasks when appropriate.     Time  3    Period  Weeks    Status  Achieved    Target Date  09/19/18        PT Long Term Goals -  10/28/18 1717      PT LONG TERM GOAL #1   Title  Patient will improve R shoulder flexion PROM to at least 90 degrees flexion and scaption/abduction to at least 90 degrees to promote ability to raise her R arm, use her R UE to perform functional tasks when appropriate.     Baseline  R shoulder PROM:  75 degrees flexion, 64 degrees abduction (06/25/2018); 90 degrees PROM for both flexion and scaption (07/17/2018); 140 degrees and 130 degrees (09/19/2018)    Time  6    Period  Weeks    Status  Achieved      PT LONG TERM GOAL #2   Title  Patient will improve R shoulder ER PROM to at least 70 degrees and IR PROM to at least 90 degrees to promote ability to use her R UE for self care as well as promote ability to don and doff clothes when appropriate.     Baseline  R shoulder PROM in scapular plane: 35 degrees ER, 72 degrees IR (06/25/2018); 54 degrees ER, 76 degrees IR (07/17/2018); 53 degrees ER AAROM, 60 degrees IR AAROM (08/06/2018); 63 degrees ER,  70 degrees IR AAROM (08/29/2018); 66 degrees ER,  60 degrees IR AAROM (09/19/2018);  60 degrees ER, 72 degrees IR AAROM (10/07/2018);  76 degrees ER, 71 degrees IR (10/28/2018)    Time  6    Period  Weeks    Status  Partially Met    Target Date  12/12/18      PT LONG TERM GOAL #3   Title  Pt will improve her R shoulder FOTO score by at least 20 points as a demonstration of improved function.     Baseline  R shoulder FOTO 4 (06/25/2018); 45 (08/29/2018)    Time  6    Period  Weeks    Status  Achieved      PT LONG TERM GOAL #4   Title  Patient will improve R shoulder flexion and scaption AAROM to 130 degrees or more to promote ability to raise her R arm when appropriate.     Baseline  AAROM not yet performed secondary to PROM for first 6 weeks (07/17/2018); supine AAROM:  115 degrees flexion, 122 degrees scaption (08/06/2018), 121 degrees flexion, 130 degrees scaption (08/29/2018); 121 degrees flexion, 125 degrees scaption (09/19/2018); supine AROM: flexion 146  degrees, scaption 130 degrees (10/07/2018)     Time  6    Period  Weeks    Status  Achieved      PT LONG TERM GOAL #5   Title  Patient will have at least 4/5 R shoulder flexion, scaption, ER and IR strength to promote ability to utilize her R UE to perform funcitonal tasks when appropriate.     Baseline  Strength not tested to allow for more healing (07/17/2018); Not yet tested (08/06/2018); R shoulder ER and IR at least 4-/5. Shoulder flexion and scaption not yet tested (not yet able to test at 90 degrees flexion or scaption) 08/29/2018); R shoulder ER and IR at least 4/5. Shoulder flexion and scaption tested in available range with lmited resistance (not yet able to test at 90 degrees flexion or scaption 09/19/2018);  flexion 4-/5 (at 90 degrees flexion), scaption 4-/5 (at 80 degrees scaption), IR 4/5, ER 4+/5 (10/28/2018)     Time  6    Period  Weeks    Status  On-going    Target Date  12/12/18      PT LONG TERM GOAL #6   Title  Pt will improve her R shoulder FOTO score to at least 60 points as a demonstration of improved function.     Baseline  45 (08/29/2018); 53 (09/19/2018); 55 (10/28/2018)    Time  6    Period  Weeks    Status  Partially Met    Target Date  12/12/18  Plan - 10/28/18 1715    Clinical Impression Statement  Pt demonstrates overall improving R shoulder flexion AROM with pt able to achieve about 125 degrees R shoulder flexion against gravity. Still demonstrates difficulty raising her R arm up in scaption/abduction with pt averaging about 60 degrees. Improved scaption AROM against gravity after manual therapy to decrease R upper trap tension as well as exercises to promote ER muscle use. Pt also demonstrates improved ER muscle strength since last measured. Pt also demonstrates crepitus in R shoulder with shoulder flexion and scaption however since about the past week per subjective reports. Slight decrease in symptoms following exercises to promote activation of  her shoulder infraspinatus muscles. Pt was also recommended to use her L UE to help raise her R arm up when performing her ROM (AAROM) exercises instead of AROM and to perform them in a crepitus free range of motion. Also upgraded her bilateral shoulder ER exercise to a red band to promote infraspinatus muscle strength to promote glenohumeral control when raising her arm up. Pt verbalized understanding.  Pt will benefit from continued skilled physical therapy services to improve strength ROM, and function.     History and Personal Factors relevant to plan of care:  Chronicity of condition prior to surgery, pain, weakness, difficulty reaching, using her R UE for functional tasks.     Clinical Presentation  Evolving    Clinical Presentation due to:  recent onset of crepitus on R shoulder     Clinical Decision Making  Moderate    Rehab Potential  Fair    Clinical Impairments Affecting Rehab Potential  (-) age, chronicity of condition prior to surgery, healing time; (+) motivated    PT Frequency  2x / week    PT Duration  6 weeks    PT Treatment/Interventions  Manual techniques;Neuromuscular re-education;Therapeutic activities;Therapeutic exercise;Patient/family education;Passive range of motion;Dry needling;Aquatic Therapy;Electrical Stimulation;Iontophoresis 39m/ml Dexamethasone;Ultrasound    PT Next Visit Plan   Can follow [place] protocol per MCarlynn SpryPA-C    Consulted and Agree with Plan of Care  Patient       Patient will benefit from skilled therapeutic intervention in order to improve the following deficits and impairments:  Pain, Postural dysfunction, Improper body mechanics, Impaired UE functional use, Decreased strength, Decreased range of motion  Visit Diagnosis: Right shoulder pain, unspecified chronicity - Plan: PT plan of care cert/re-cert  Muscle weakness (generalized) - Plan: PT plan of care cert/re-cert     Problem List There are no active  problems to display for this patient.   MJoneen BoersPT, DPT   10/28/2018, 6:53 PM  [facility] [address] [place] [address]Phone: [phone]  Fax:  [phone] Name: Alyssa BOBERMRN: [id number] of Birth: 103-Nov-1947

## 2018-10-28 NOTE — Patient Instructions (Addendum)
Upgraded ER exercise to red band   Pt was recommended to use L UE assist when raising her R arm up to help decrease R shoulder crepitus (creptius when raising her R arm up against gravity/AROM) when performing her ROM exercises and to perform her ROM exercises in a crepitus free range of motion. Pt verbalized understanding.

## 2018-10-30 ENCOUNTER — Ambulatory Visit: Payer: PRIVATE HEALTH INSURANCE

## 2018-10-30 ENCOUNTER — Telehealth: Payer: Self-pay

## 2018-10-30 NOTE — Telephone Encounter (Signed)
Called patient to check up on her to see how she is doing. Pt states that the ER exercise with the band helps with her shoulder. Shoulder does not rub as much when she raises her arm up afterwards. Doing her exercises in a pain free range of motion. Pt was also recommended that if she feels like the rubbing in her shoulder is getting worse, it might be a good idea to call her surgeon just to be safe. Pt verbalized understanding.

## 2018-10-30 NOTE — Telephone Encounter (Signed)
Called Dr. Odis LusterBowers at [facility] earlier today and left message with his assistant pertaining to pt L shoulder crepitus when raising her arm up. Return phone call requested. Phone number provided.

## 2018-11-04 ENCOUNTER — Ambulatory Visit: Payer: PRIVATE HEALTH INSURANCE | Attending: Orthopedic Surgery

## 2018-11-04 DIAGNOSIS — M6281 Muscle weakness (generalized): Secondary | ICD-10-CM

## 2018-11-04 DIAGNOSIS — M25511 Pain in right shoulder: Secondary | ICD-10-CM | POA: Diagnosis not present

## 2018-11-04 NOTE — Therapy (Signed)
[facility] [address], [place], [address] Phone: [phone]   Fax:  [phone]  Physical Therapy Treatment  Patient Details  Name: Alyssa Baird MRN: [id number] Date of Birth: 1946-02-17 Referring Provider (PT): Kurtis Bushman, MD   Encounter Date: 11/04/2018  PT End of Session - 11/04/18 1605    Visit Number  32    Number of Visits  44    Date for PT Re-Evaluation  12/12/18    Authorization Type  32    Authorization Time Period  of 42 worker's comp    PT Start Time  6283    PT Stop Time  1650    PT Time Calculation (min)  44 min    Activity Tolerance  Patient tolerated treatment well    Behavior During Therapy  WFL for tasks assessed/performed       Past Medical History:  Diagnosis Date  . Arthritis   . Asthma    only URI  . Hyperlipidemia   . Hypertension     Past Surgical History:  Procedure Laterality Date  . ABDOMINAL HYSTERECTOMY    . CHOLECYSTECTOMY    . SHOULDER ARTHROSCOPY WITH ROTATOR CUFF REPAIR Right 06/11/2018   Procedure: SHOULDER ARTHROSCOPY WITH ROTATOR CUFF REPAIR;  Surgeon: Lovell Sheehan, MD;  Location: [facility] ORS;  Service: Orthopedics;  Laterality: Right;    There were no vitals filed for this visit.  Subjective Assessment - 11/04/18 1606    Subjective  R shoulder is doing a little bit better. Kind of sore. No pain at rest.     Pertinent History  S/P R rotator cuff surgery on 06/11/2018. Pt was lifting a bag of linnen at work last year (07/01/2017) which resulted in injury. Thought her shoulder would get better on its own but it got worse.  Had PT at [facility] which did not fix it.   Has not had any PT since her surgery. Was told to perform pendulums at home.  Forgot to use her abduction pillow for her sling.  Also feels pain at the top of her R shoulder blade.  Pt is R hand dominant.  No falls within the last 6 months.  No fear of falling.     Currently in Pain?  Yes    Pain Score  3    when raising her R arm up   Pain Onset  More than a month ago                               PT Education - 11/04/18 1613    Education Details  ther-ex    Person(s) Educated  Patient    Methods  Explanation;Demonstration;Tactile cues;Verbal cues    Comprehension  Returned demonstration;Verbalized understanding      Objectives  S/P20+ weeks  No latex band allergies  Next MD appointment is 11/21/2018   MedbridgeAccess Code: [email or web address]  At start of session R shoulder flexion AROM in standing:110degrees,  scaption AROM:70degrees  No grinding/crunching per pt    Therapeutic exercise   Isometric R shoulder flexion at door 10x5 seconds for 2 sets   Cues to keep scapular retraction and depression. Tendency for protraction and shoulder shrug  Seated manually resisted R scapular depression isometrics with PT with scapula in neutral 10x5 seconds for 3 sets  Seated manually resisted R scapular retraction targeting lower trap muscle 10x5 seconds for 3 sets  B shoulder ER resisting red band 10x2  Reclined R shoulder flexion with scapular retraction 5x2  Minimal crepitus with scapular retraction    Improved exercise technique, movement at target joints, use of target muscles after min to mod verbal, visual, tactile cues.     Manual Therapy  Seated STM R distal pectoralis muscle. Decreased tenderness around clavicle with scapular retraction  Seated STM R upper trap muscle   Continued working on scapular and infraspinatus strengthening as well as improving glenohumeral control with raising her arm up. Decreased crepitus with shoulder flexion when pt performs with scapular retraction. Pt will benefit from continued skilled physical therapy services to improve ROM, strength, scapular control, function.      PT Short Term Goals - 09/19/18 0906      PT SHORT TERM GOAL #1   Title  Patient will be  independent with her HEP to promote ability to raise her R arm against gravity and use it for functional tasks when appropriate.     Time  3    Period  Weeks    Status  Achieved    Target Date  09/19/18        PT Long Term Goals - 10/28/18 1717      PT LONG TERM GOAL #1   Title  Patient will improve R shoulder flexion PROM to at least 90 degrees flexion and scaption/abduction to at least 90 degrees to promote ability to raise her R arm, use her R UE to perform functional tasks when appropriate.     Baseline  R shoulder PROM: 75 degrees flexion, 64 degrees abduction (06/25/2018); 90 degrees PROM for both flexion and scaption (07/17/2018); 140 degrees and 130 degrees (09/19/2018)    Time  6    Period  Weeks    Status  Achieved      PT LONG TERM GOAL #2   Title  Patient will improve R shoulder ER PROM to at least 70 degrees and IR PROM to at least 90 degrees to promote ability to use her R UE for self care as well as promote ability to don and doff clothes when appropriate.     Baseline  R shoulder PROM in scapular plane: 35 degrees ER, 72 degrees IR (06/25/2018); 54 degrees ER, 76 degrees IR (07/17/2018); 53 degrees ER AAROM, 60 degrees IR AAROM (08/06/2018); 63 degrees ER,  70 degrees IR AAROM (08/29/2018); 66 degrees ER,  60 degrees IR AAROM (09/19/2018);  60 degrees ER, 72 degrees IR AAROM (10/07/2018);  76 degrees ER, 71 degrees IR (10/28/2018)    Time  6    Period  Weeks    Status  Partially Met    Target Date  12/12/18      PT LONG TERM GOAL #3   Title  Pt will improve her R shoulder FOTO score by at least 20 points as a demonstration of improved function.     Baseline  R shoulder FOTO 4 (06/25/2018); 45 (08/29/2018)    Time  6    Period  Weeks    Status  Achieved      PT LONG TERM GOAL #4   Title  Patient will improve R shoulder flexion and scaption AAROM to 130 degrees or more to promote ability to raise her R arm when appropriate.     Baseline  AAROM not yet performed secondary to  PROM for first 6 weeks (07/17/2018); supine AAROM:  115 degrees flexion, 122 degrees scaption (08/06/2018), 121 degrees flexion, 130 degrees  scaption (08/29/2018); 121 degrees flexion, 125 degrees scaption (09/19/2018); supine AROM: flexion 146 degrees, scaption 130 degrees (10/07/2018)     Time  6    Period  Weeks    Status  Achieved      PT LONG TERM GOAL #5   Title  Patient will have at least 4/5 R shoulder flexion, scaption, ER and IR strength to promote ability to utilize her R UE to perform funcitonal tasks when appropriate.     Baseline  Strength not tested to allow for more healing (07/17/2018); Not yet tested (08/06/2018); R shoulder ER and IR at least 4-/5. Shoulder flexion and scaption not yet tested (not yet able to test at 90 degrees flexion or scaption) 08/29/2018); R shoulder ER and IR at least 4/5. Shoulder flexion and scaption tested in available range with lmited resistance (not yet able to test at 90 degrees flexion or scaption 09/19/2018);  flexion 4-/5 (at 90 degrees flexion), scaption 4-/5 (at 80 degrees scaption), IR 4/5, ER 4+/5 (10/28/2018)     Time  6    Period  Weeks    Status  On-going    Target Date  12/12/18      PT LONG TERM GOAL #6   Title  Pt will improve her R shoulder FOTO score to at least 60 points as a demonstration of improved function.     Baseline  45 (08/29/2018); 53 (09/19/2018); 55 (10/28/2018)    Time  6    Period  Weeks    Status  Partially Met    Target Date  12/12/18            Plan - 11/04/18 1614    Clinical Impression Statement  Continued working on scapular and infraspinatus strengthening as well as improving glenohumeral control with raising her arm up. Decreased crepitus with shoulder flexion when pt performs with scapular retraction. Pt will benefit from continued skilled physical therapy services to improve ROM, strength, scapular control, function.     Rehab Potential  Fair    Clinical Impairments Affecting Rehab Potential  (-) age,  chronicity of condition prior to surgery, healing time; (+) motivated    PT Frequency  2x / week    PT Duration  6 weeks    PT Treatment/Interventions  Manual techniques;Neuromuscular re-education;Therapeutic activities;Therapeutic exercise;Patient/family education;Passive range of motion;Dry needling;Aquatic Therapy;Electrical Stimulation;Iontophoresis '4mg'$ /ml Dexamethasone;Ultrasound    PT Next Visit Plan   Can follow [place] rotator cuff protocol per Carlynn Spry PA-C    Consulted and Agree with Plan of Care  Patient       Patient will benefit from skilled therapeutic intervention in order to improve the following deficits and impairments:  Pain, Postural dysfunction, Improper body mechanics, Impaired UE functional use, Decreased strength, Decreased range of motion  Visit Diagnosis: Right shoulder pain, unspecified chronicity  Muscle weakness (generalized)     Problem List There are no active problems to display for this patient.  Joneen Boers PT, DPT    11/04/2018, 5:00 PM  [facility] [address], [place], [address] Phone: [phone]   Fax:  [phone]  Name: GRACELEE STEMMLER MRN: [id number] Date of Birth: August 22, 1946

## 2018-11-07 ENCOUNTER — Ambulatory Visit: Payer: PRIVATE HEALTH INSURANCE

## 2018-11-07 DIAGNOSIS — M6281 Muscle weakness (generalized): Secondary | ICD-10-CM

## 2018-11-07 DIAGNOSIS — M25511 Pain in right shoulder: Secondary | ICD-10-CM | POA: Diagnosis not present

## 2018-11-07 NOTE — Therapy (Signed)
[facility] [address], [place], [address] Phone: [phone]   Fax:  [phone]  Physical Therapy Treatment  Patient Details  Name: Alyssa Baird MRN: [id number] Date of Birth: 1946-08-15 Referring Provider (PT): Kurtis Bushman, MD   Encounter Date: 11/07/2018  PT End of Session - 11/07/18 1555    Visit Number  33    Number of Visits  44    Date for PT Re-Evaluation  12/12/18    Authorization Type  33    Authorization Time Period  of 45 worker's comp    PT Start Time  1602    PT Stop Time  1642    PT Time Calculation (min)  40 min    Activity Tolerance  Patient tolerated treatment well    Behavior During Therapy  WFL for tasks assessed/performed       Past Medical History:  Diagnosis Date  . Arthritis   . Asthma    only URI  . Hyperlipidemia   . Hypertension     Past Surgical History:  Procedure Laterality Date  . ABDOMINAL HYSTERECTOMY    . CHOLECYSTECTOMY    . SHOULDER ARTHROSCOPY WITH ROTATOR CUFF REPAIR Right 06/11/2018   Procedure: SHOULDER ARTHROSCOPY WITH ROTATOR CUFF REPAIR;  Surgeon: Lovell Sheehan, MD;  Location: [facility];  Service: Orthopedics;  Laterality: Right;    There were no vitals filed for this visit.  Subjective Assessment - 11/07/18 1603    Subjective  R shoulder is doing good. No pain currently. Still crunching a little bit. Getting better if she keeps her shoulder in place.     Pertinent History  S/P R rotator cuff surgery on 06/11/2018. Pt was lifting a bag of linnen at work last year (07/01/2017) which resulted in injury. Thought her shoulder would get better on its own but it got worse.  Had PT at [facility] which did not fix it.   Has not had any PT since her surgery. Was told to perform pendulums at home.  Forgot to use her abduction pillow for her sling.  Also feels pain at the top of her R shoulder blade.  Pt is R hand dominant.  No falls within the last 6 months.   No fear of falling.     Currently in Pain?  No/denies    Pain Score  0-No pain    Pain Onset  More than a month ago                               PT Education - 11/07/18 1621    Education Details  ther-ex    Person(s) Educated  Patient    Methods  Explanation;Demonstration;Tactile cues;Verbal cues    Comprehension  Returned demonstration;Verbalized understanding        Objectives  S/P20+ weeks  No latex band allergies  Next MD appointment is 11/21/2018   MedbridgeAccess Code: [email or web address]  At start of session R shoulder flexion AROM in standing:120degrees,  scaption AROM:66degrees   Manual Therapy  Seated STM R distal pectoralis muscle. Decreased tenderness around clavicle with scapular retraction  Seated STM R upper trap muscle   Therapeutic exercise   Seated manually resisted R scapular depression isometrics with PT with scapula in neutral 10x5 seconds for 3 sets  Seated manually resisted R scapular retraction targeting lower trap muscle 10x5 seconds for 3 sets   B shoulder ER resisting  red band 10x2  Standing PT assisted R shoulder scaption 10x, then 5x2  in crepitus free ROM.   Humeral head tendency to go superior and anterior palpated.   Standing R shoulder extension with scapular retraction, palms forward (to promote ER muscle use) yellow band 10x3  UE ranger with PT assist for scapular retraction  Flexion 10x2  scaption 5x2   Improved exercise technique, movement at target joints, use of target muscles after min to mod verbal, visual, tactile cues.   Decreased crepitus with scapular retraction and infraspinatus muscle use during flexion and scaption to promote better glenohumeral control. Improving R shoulder flexion AROM. Still demonstrates difficulty with scaption. Pt will benefit from continued skilled physical therapy services to improve ROM, strength, glenohumeral control, and function.                  PT Short Term Goals - 09/19/18 0906      PT SHORT TERM GOAL #1   Title  Patient will be independent with her HEP to promote ability to raise her R arm against gravity and use it for functional tasks when appropriate.     Time  3    Period  Weeks    Status  Achieved    Target Date  09/19/18        PT Long Term Goals - 10/28/18 1717      PT LONG TERM GOAL #1   Title  Patient will improve R shoulder flexion PROM to at least 90 degrees flexion and scaption/abduction to at least 90 degrees to promote ability to raise her R arm, use her R UE to perform functional tasks when appropriate.     Baseline  R shoulder PROM: 75 degrees flexion, 64 degrees abduction (06/25/2018); 90 degrees PROM for both flexion and scaption (07/17/2018); 140 degrees and 130 degrees (09/19/2018)    Time  6    Period  Weeks    Status  Achieved      PT LONG TERM GOAL #2   Title  Patient will improve R shoulder ER PROM to at least 70 degrees and IR PROM to at least 90 degrees to promote ability to use her R UE for self care as well as promote ability to don and doff clothes when appropriate.     Baseline  R shoulder PROM in scapular plane: 35 degrees ER, 72 degrees IR (06/25/2018); 54 degrees ER, 76 degrees IR (07/17/2018); 53 degrees ER AAROM, 60 degrees IR AAROM (08/06/2018); 63 degrees ER,  70 degrees IR AAROM (08/29/2018); 66 degrees ER,  60 degrees IR AAROM (09/19/2018);  60 degrees ER, 72 degrees IR AAROM (10/07/2018);  76 degrees ER, 71 degrees IR (10/28/2018)    Time  6    Period  Weeks    Status  Partially Met    Target Date  12/12/18      PT LONG TERM GOAL #3   Title  Pt will improve her R shoulder FOTO score by at least 20 points as a demonstration of improved function.     Baseline  R shoulder FOTO 4 (06/25/2018); 45 (08/29/2018)    Time  6    Period  Weeks    Status  Achieved      PT LONG TERM GOAL #4   Title  Patient will improve R shoulder flexion and scaption AAROM to 130  degrees or more to promote ability to raise her R arm when appropriate.     Baseline  AAROM not yet  performed secondary to PROM for first 6 weeks (07/17/2018); supine AAROM:  115 degrees flexion, 122 degrees scaption (08/06/2018), 121 degrees flexion, 130 degrees scaption (08/29/2018); 121 degrees flexion, 125 degrees scaption (09/19/2018); supine AROM: flexion 146 degrees, scaption 130 degrees (10/07/2018)     Time  6    Period  Weeks    Status  Achieved      PT LONG TERM GOAL #5   Title  Patient will have at least 4/5 R shoulder flexion, scaption, ER and IR strength to promote ability to utilize her R UE to perform funcitonal tasks when appropriate.     Baseline  Strength not tested to allow for more healing (07/17/2018); Not yet tested (08/06/2018); R shoulder ER and IR at least 4-/5. Shoulder flexion and scaption not yet tested (not yet able to test at 90 degrees flexion or scaption) 08/29/2018); R shoulder ER and IR at least 4/5. Shoulder flexion and scaption tested in available range with lmited resistance (not yet able to test at 90 degrees flexion or scaption 09/19/2018);  flexion 4-/5 (at 90 degrees flexion), scaption 4-/5 (at 80 degrees scaption), IR 4/5, ER 4+/5 (10/28/2018)     Time  6    Period  Weeks    Status  On-going    Target Date  12/12/18      PT LONG TERM GOAL #6   Title  Pt will improve her R shoulder FOTO score to at least 60 points as a demonstration of improved function.     Baseline  45 (08/29/2018); 53 (09/19/2018); 55 (10/28/2018)    Time  6    Period  Weeks    Status  Partially Met    Target Date  12/12/18            Plan - 11/07/18 1554    Clinical Impression Statement  Decreased crepitus with scapular retraction and infraspinatus muscle use during flexion and scaption to promote better glenohumeral control. Improving R shoulder flexion AROM. Still demonstrates difficulty with scaption. Pt will benefit from continued skilled physical therapy services to improve ROM,  strength, glenohumeral control, and function.     Rehab Potential  Fair    Clinical Impairments Affecting Rehab Potential  (-) age, chronicity of condition prior to surgery, healing time; (+) motivated    PT Frequency  2x / week    PT Duration  6 weeks    PT Treatment/Interventions  Manual techniques;Neuromuscular re-education;Therapeutic activities;Therapeutic exercise;Patient/family education;Passive range of motion;Dry needling;Aquatic Therapy;Electrical Stimulation;Iontophoresis 56m/ml Dexamethasone;Ultrasound    PT Next Visit Plan   [place] per MCarlynn SpryPA-C    Consulted and Agree with Plan of Care  Patient       Patient will benefit from skilled therapeutic intervention in order to improve the following deficits and impairments:  Pain, Postural dysfunction, Improper body mechanics, Impaired UE functional use, Decreased strength, Decreased range of motion  Visit Diagnosis: Right shoulder pain, unspecified chronicity  Muscle weakness (generalized)     Problem List There are no active problems to display for this patient.   MJoneen BoersPT, DPT   11/07/2018, 5:02 PM  [facility] [address] [place] [address]Phone: [phone]  Fax:  [phone] Name: Alyssa AXFORDMRN: [id number] of Birth: 1Mar 02, 1947

## 2018-11-11 ENCOUNTER — Ambulatory Visit: Payer: PRIVATE HEALTH INSURANCE

## 2018-11-12 ENCOUNTER — Ambulatory Visit: Payer: PRIVATE HEALTH INSURANCE | Admitting: Physical Therapy

## 2018-11-12 ENCOUNTER — Encounter: Payer: Self-pay | Admitting: Physical Therapy

## 2018-11-12 ENCOUNTER — Ambulatory Visit: Payer: PRIVATE HEALTH INSURANCE

## 2018-11-12 DIAGNOSIS — M25511 Pain in right shoulder: Secondary | ICD-10-CM

## 2018-11-12 DIAGNOSIS — M6281 Muscle weakness (generalized): Secondary | ICD-10-CM

## 2018-11-12 NOTE — Therapy (Signed)
[facility] [address], [place], [address] Phone: [phone]   Fax:  [phone]  Physical Therapy Treatment  Patient Details  Name: Alyssa Baird MRN: [id number] Date of Birth: March 17, 1946 Referring Provider (PT): Kurtis Bushman, MD   Encounter Date: 11/12/2018  PT End of Session - 11/12/18 1844    Visit Number  34    Number of Visits  44    Date for PT Re-Evaluation  12/12/18    Authorization Type  34    Authorization Time Period  of 64 worker's comp    PT Start Time  1545    PT Stop Time  1625    PT Time Calculation (min)  40 min    Activity Tolerance  Patient tolerated treatment well    Behavior During Therapy  WFL for tasks assessed/performed       Past Medical History:  Diagnosis Date  . Arthritis   . Asthma    only URI  . Hyperlipidemia   . Hypertension     Past Surgical History:  Procedure Laterality Date  . ABDOMINAL HYSTERECTOMY    . CHOLECYSTECTOMY    . SHOULDER ARTHROSCOPY WITH ROTATOR CUFF REPAIR Right 06/11/2018   Procedure: SHOULDER ARTHROSCOPY WITH ROTATOR CUFF REPAIR;  Surgeon: Lovell Sheehan, MD;  Location: [facility] ORS;  Service: Orthopedics;  Laterality: Right;    There were no vitals filed for this visit.  Subjective Assessment - 11/12/18 1547    Subjective  Patient reports her right shoulder is feeling well with no pain upon arrival. She had some pain on Sunday night that she felt may have come on after doing her exercises that day.  She had trouble all night but it went away the next morning. She states her right hand hurts and wakes her during the night but it is okay during the day. she states she felt good after her last visit.     Pertinent History  S/P R rotator cuff surgery on 06/11/2018. Pt was lifting a bag of linnen at work last year (07/01/2017) which resulted in injury. Thought her shoulder would get better on its own but it got worse.  Had PT at [facility] which  did not fix it.   Has not had any PT since her surgery. Was told to perform pendulums at home.  Forgot to use her abduction pillow for her sling.  Also feels pain at the top of her R shoulder blade.  Pt is R hand dominant.  No falls within the last 6 months.  No fear of falling.     Currently in Pain?  No/denies    Pain Onset  More than a month ago        Objectives  S/P20+weeks  No latex band allergies  Next MD appointment is 11/21/2018   MedbridgeAccess Code: [email or web address]  At start of session R shoulder flexion AROM in standing:80degrees,  scaption AROM:70degrees   Manual Therapy  Seated palpation R distal pectoralis muscle. Not very tender today.  Seated STM R upper trap muscle. Decreased tenderness at top of shoulder after   Therapeutic exercise   Seated manually resisted R scapular depression isometrics with PT with scapula in neutral 10x5 seconds for 3 sets  Seated manually resisted R scapular retraction targeting lower trap muscle 10x5 seconds for 3 sets  B shoulder ER resisting red band 10x2  Standing PT assisted R shoulder scaption 10x2, then in crepitus minimized ROM.  Humeral head tendency to go superior and anterior palpated. Tactile cuing to depress shoulder  Standing R shoulder extension with scapular retraction, palms forward (to promote ER muscle use) yellow band x20, x10  UE ranger with PT assist for scapular retraction             Flexion 10x2             scaption 10x2  Patient response to treatment:  Pt tolerated treatment well. Pt was able to complete all exercises with minimal to no lasting increase in pain or discomfort. She had more difficulty with AROM flexion at start of visit than previous sessions.  Pt required cuing for proper technique and to facilitate improved neuromuscular control, strength, range of motion, and functional ability.She improved exercise technique, movement at target  joints, use of target muscles after min to mod verbal, visual, tactile cues. Still demonstrates difficulty with scaption but improved with cuing. Pt will benefit from continued skilled physical therapy services to improve ROM, strength, glenohumeral control, and function. she is making progress towards goals.   PT Education - 11/12/18 1616    Education Details  exercise form/purpose    Person(s) Educated  Patient    Methods  Explanation;Demonstration;Tactile cues;Verbal cues    Comprehension  Verbalized understanding;Returned demonstration       PT Short Term Goals - 09/19/18 0906      PT SHORT TERM GOAL #1   Title  Patient will be independent with her HEP to promote ability to raise her R arm against gravity and use it for functional tasks when appropriate.     Time  3    Period  Weeks    Status  Achieved    Target Date  09/19/18        PT Long Term Goals - 10/28/18 1717      PT LONG TERM GOAL #1   Title  Patient will improve R shoulder flexion PROM to at least 90 degrees flexion and scaption/abduction to at least 90 degrees to promote ability to raise her R arm, use her R UE to perform functional tasks when appropriate.     Baseline  R shoulder PROM: 75 degrees flexion, 64 degrees abduction (06/25/2018); 90 degrees PROM for both flexion and scaption (07/17/2018); 140 degrees and 130 degrees (09/19/2018)    Time  6    Period  Weeks    Status  Achieved      PT LONG TERM GOAL #2   Title  Patient will improve R shoulder ER PROM to at least 70 degrees and IR PROM to at least 90 degrees to promote ability to use her R UE for self care as well as promote ability to don and doff clothes when appropriate.     Baseline  R shoulder PROM in scapular plane: 35 degrees ER, 72 degrees IR (06/25/2018); 54 degrees ER, 76 degrees IR (07/17/2018); 53 degrees ER AAROM, 60 degrees IR AAROM (08/06/2018); 63 degrees ER,  70 degrees IR AAROM (08/29/2018); 66 degrees ER,  60 degrees IR AAROM (09/19/2018);  60  degrees ER, 72 degrees IR AAROM (10/07/2018);  76 degrees ER, 71 degrees IR (10/28/2018)    Time  6    Period  Weeks    Status  Partially Met    Target Date  12/12/18      PT LONG TERM GOAL #3   Title  Pt will improve her R shoulder FOTO score by at least 20 points as a demonstration of improved  function.     Baseline  R shoulder FOTO 4 (06/25/2018); 45 (08/29/2018)    Time  6    Period  Weeks    Status  Achieved      PT LONG TERM GOAL #4   Title  Patient will improve R shoulder flexion and scaption AAROM to 130 degrees or more to promote ability to raise her R arm when appropriate.     Baseline  AAROM not yet performed secondary to PROM for first 6 weeks (07/17/2018); supine AAROM:  115 degrees flexion, 122 degrees scaption (08/06/2018), 121 degrees flexion, 130 degrees scaption (08/29/2018); 121 degrees flexion, 125 degrees scaption (09/19/2018); supine AROM: flexion 146 degrees, scaption 130 degrees (10/07/2018)     Time  6    Period  Weeks    Status  Achieved      PT LONG TERM GOAL #5   Title  Patient will have at least 4/5 R shoulder flexion, scaption, ER and IR strength to promote ability to utilize her R UE to perform funcitonal tasks when appropriate.     Baseline  Strength not tested to allow for more healing (07/17/2018); Not yet tested (08/06/2018); R shoulder ER and IR at least 4-/5. Shoulder flexion and scaption not yet tested (not yet able to test at 90 degrees flexion or scaption) 08/29/2018); R shoulder ER and IR at least 4/5. Shoulder flexion and scaption tested in available range with lmited resistance (not yet able to test at 90 degrees flexion or scaption 09/19/2018);  flexion 4-/5 (at 90 degrees flexion), scaption 4-/5 (at 80 degrees scaption), IR 4/5, ER 4+/5 (10/28/2018)     Time  6    Period  Weeks    Status  On-going    Target Date  12/12/18      PT LONG TERM GOAL #6   Title  Pt will improve her R shoulder FOTO score to at least 60 points as a demonstration of improved  function.     Baseline  45 (08/29/2018); 53 (09/19/2018); 55 (10/28/2018)    Time  6    Period  Weeks    Status  Partially Met    Target Date  12/12/18            Plan - 11/12/18 1845    Clinical Impression Statement  Pt tolerated treatment well. Pt was able to complete all exercises with minimal to no lasting increase in pain or discomfort. She had more difficulty with AROM flexion at start of visit than previous sessions.  Pt required cuing for proper technique and to facilitate improved neuromuscular control, strength, range of motion, and functional ability.She improved exercise technique, movement at target joints, use of target muscles after min to mod verbal, visual, tactile cues. Still demonstrates difficulty with scaption but improved with cuing. Pt will benefit from continued skilled physical therapy services to improve ROM, strength, glenohumeral control, and function. she is making progress towards goals.    Rehab Potential  Fair    Clinical Impairments Affecting Rehab Potential  (-) age, chronicity of condition prior to surgery, healing time; (+) motivated    PT Frequency  2x / week    PT Duration  6 weeks    PT Treatment/Interventions  Manual techniques;Neuromuscular re-education;Therapeutic activities;Therapeutic exercise;Patient/family education;Passive range of motion;Dry needling;Aquatic Therapy;Electrical Stimulation;Iontophoresis '4mg'$ /ml Dexamethasone;Ultrasound    PT Next Visit Plan   Can follow [facility] [place] rotator cuff protocol per Carlynn Spry PA-C    Consulted and Agree with Plan of Care  Patient  Patient will benefit from skilled therapeutic intervention in order to improve the following deficits and impairments:  Pain, Postural dysfunction, Improper body mechanics, Impaired UE functional use, Decreased strength, Decreased range of motion  Visit Diagnosis: Right shoulder pain, unspecified chronicity  Muscle weakness (generalized)     Problem  List There are no active problems to display for this patient.   Nancy Nordmann, PT, DPT 11/12/2018, 6:47 PM  [facility] [address], [place], [address] Phone: [phone]   Fax:  [phone]  Name: Alyssa Baird MRN: [id number] Date of Birth: 05/02/1946

## 2018-11-14 ENCOUNTER — Ambulatory Visit: Payer: PRIVATE HEALTH INSURANCE

## 2018-11-14 DIAGNOSIS — M25511 Pain in right shoulder: Secondary | ICD-10-CM | POA: Diagnosis not present

## 2018-11-14 DIAGNOSIS — M6281 Muscle weakness (generalized): Secondary | ICD-10-CM

## 2018-11-14 NOTE — Therapy (Signed)
[facility] [address], [place], [address] Phone: [phone]   Fax:  [phone]  Physical Therapy Treatment  Patient Details  Name: Alyssa Baird MRN: [id number] Date of Birth: 1946/09/27 Referring Provider (PT): Kurtis Bushman, MD   Encounter Date: 11/14/2018  PT End of Session - 11/14/18 1429    Visit Number  35    Number of Visits  44    Date for PT Re-Evaluation  12/12/18    Authorization Type  35    Authorization Time Period  of 57 worker's comp    PT Start Time  1430    PT Stop Time  1503    PT Time Calculation (min)  33 min    Activity Tolerance  Patient tolerated treatment well    Behavior During Therapy  WFL for tasks assessed/performed       Past Medical History:  Diagnosis Date  . Arthritis   . Asthma    only URI  . Hyperlipidemia   . Hypertension     Past Surgical History:  Procedure Laterality Date  . ABDOMINAL HYSTERECTOMY    . CHOLECYSTECTOMY    . SHOULDER ARTHROSCOPY WITH ROTATOR CUFF REPAIR Right 06/11/2018   Procedure: SHOULDER ARTHROSCOPY WITH ROTATOR CUFF REPAIR;  Surgeon: Lovell Sheehan, MD;  Location: [facility] OR[facility];  Service: Orthopedics;  Laterality: Right;    There were no vitals filed for this visit.  Subjective Assessment - 11/14/18 1430    Subjective  Wants to sit up today instead of doing stuff lying down because of sinus drainage. R shoulder is kind of sore (distal clavicle area).  3/10 currently.  The crepitus in her shoulder is a little better.     Pertinent History  S/P R rotator cuff surgery on 06/11/2018. Pt was lifting a bag of linnen at work last year (07/01/2017) which resulted in injury. Thought her shoulder would get better on its own but it got worse.  Had PT at [facility] which did not fix it.   Has not had any PT since her surgery. Was told to perform pendulums at home.  Forgot to use her abduction pillow for her sling.  Also feels pain at the top of her R  shoulder blade.  Pt is R hand dominant.  No falls within the last 6 months.  No fear of falling.     Currently in Pain?  Yes    Pain Score  3     Pain Onset  More than a month ago                               PT Education - 11/14/18 1447    Education Details  ther-ex    Person(s) Educated  Patient    Methods  Explanation;Demonstration;Tactile cues;Verbal cues    Comprehension  Returned demonstration;Verbalized understanding      Objectives  S/P20+weeks  No latex band allergies  Next MD appointment is 11/21/2018   MedbridgeAccess Code: [email or web address]    Manual Therapy  TTP R distal clavicle with reproduction of pain.   Seated STM R distal pectoralis muscle.   Seated STM R upper trap muscle   Therapeutic exercise  Seated manually resisted R scapular retraction targeting lower trap muscle 10x5 seconds for 3 sets  Seated manually resisted R scapular depression isometrics with PT with scapula in neutral 10x5 seconds for 3 sets   L cervical  side bend stretch to stretch R upper trap muscle 10x5 seconds   R shoulder flexion AAROM with PT with gentle manually resisted extension 5x2  Decreased R shoulder crepitus overall heard and palpated.  R shoulder scaption AAROM with PT with gentle manually resistance returning to neutral 5x2    Improved exercise technique, movement at target joints, use of target muscles after min to mod verbal, visual, tactile cues.     Decreasing R shoulder crepitus overall heard and palpated with shoulder flexion and scaption AAROM. Continued working on decreasing R pectoralis, R upper trap muscle tension as well as worked on scapular control and activation of posterior shoulder muscles with flexion and scaption AAROM. 115 degrees R shoulder flexion, 74 degrees scaption after session. Pt will benefit from continued skilled physical therapy services to improve ROM, scapular control, strength, and improve  function.        PT Short Term Goals - 09/19/18 0906      PT SHORT TERM GOAL #1   Title  Patient will be independent with her HEP to promote ability to raise her R arm against gravity and use it for functional tasks when appropriate.     Time  3    Period  Weeks    Status  Achieved    Target Date  09/19/18        PT Long Term Goals - 10/28/18 1717      PT LONG TERM GOAL #1   Title  Patient will improve R shoulder flexion PROM to at least 90 degrees flexion and scaption/abduction to at least 90 degrees to promote ability to raise her R arm, use her R UE to perform functional tasks when appropriate.     Baseline  R shoulder PROM: 75 degrees flexion, 64 degrees abduction (06/25/2018); 90 degrees PROM for both flexion and scaption (07/17/2018); 140 degrees and 130 degrees (09/19/2018)    Time  6    Period  Weeks    Status  Achieved      PT LONG TERM GOAL #2   Title  Patient will improve R shoulder ER PROM to at least 70 degrees and IR PROM to at least 90 degrees to promote ability to use her R UE for self care as well as promote ability to don and doff clothes when appropriate.     Baseline  R shoulder PROM in scapular plane: 35 degrees ER, 72 degrees IR (06/25/2018); 54 degrees ER, 76 degrees IR (07/17/2018); 53 degrees ER AAROM, 60 degrees IR AAROM (08/06/2018); 63 degrees ER,  70 degrees IR AAROM (08/29/2018); 66 degrees ER,  60 degrees IR AAROM (09/19/2018);  60 degrees ER, 72 degrees IR AAROM (10/07/2018);  76 degrees ER, 71 degrees IR (10/28/2018)    Time  6    Period  Weeks    Status  Partially Met    Target Date  12/12/18      PT LONG TERM GOAL #3   Title  Pt will improve her R shoulder FOTO score by at least 20 points as a demonstration of improved function.     Baseline  R shoulder FOTO 4 (06/25/2018); 45 (08/29/2018)    Time  6    Period  Weeks    Status  Achieved      PT LONG TERM GOAL #4   Title  Patient will improve R shoulder flexion and scaption AAROM to 130 degrees or  more to promote ability to raise her R arm when appropriate.  Baseline  AAROM not yet performed secondary to PROM for first 6 weeks (07/17/2018); supine AAROM:  115 degrees flexion, 122 degrees scaption (08/06/2018), 121 degrees flexion, 130 degrees scaption (08/29/2018); 121 degrees flexion, 125 degrees scaption (09/19/2018); supine AROM: flexion 146 degrees, scaption 130 degrees (10/07/2018)     Time  6    Period  Weeks    Status  Achieved      PT LONG TERM GOAL #5   Title  Patient will have at least 4/5 R shoulder flexion, scaption, ER and IR strength to promote ability to utilize her R UE to perform funcitonal tasks when appropriate.     Baseline  Strength not tested to allow for more healing (07/17/2018); Not yet tested (08/06/2018); R shoulder ER and IR at least 4-/5. Shoulder flexion and scaption not yet tested (not yet able to test at 90 degrees flexion or scaption) 08/29/2018); R shoulder ER and IR at least 4/5. Shoulder flexion and scaption tested in available range with lmited resistance (not yet able to test at 90 degrees flexion or scaption 09/19/2018);  flexion 4-/5 (at 90 degrees flexion), scaption 4-/5 (at 80 degrees scaption), IR 4/5, ER 4+/5 (10/28/2018)     Time  6    Period  Weeks    Status  On-going    Target Date  12/12/18      PT LONG TERM GOAL #6   Title  Pt will improve her R shoulder FOTO score to at least 60 points as a demonstration of improved function.     Baseline  45 (08/29/2018); 53 (09/19/2018); 55 (10/28/2018)    Time  6    Period  Weeks    Status  Partially Met    Target Date  12/12/18            Plan - 11/14/18 1449    Clinical Impression Statement  Decreasing R shoulder crepitus overall heard and palpated with shoulder flexion and scaption AAROM. Continued working on decreasing R pectoralis, R upper trap muscle tension as well as worked on scapular control and activation of posterior shoulder muscles with flexion and scaption AAROM. 115 degrees R shoulder  flexion, 74 degrees scaption after session. Pt will benefit from continued skilled physical therapy services to improve ROM, scapular control, strength, and improve function.     Rehab Potential  Fair    Clinical Impairments Affecting Rehab Potential  (-) age, chronicity of condition prior to surgery, healing time; (+) motivated    PT Frequency  2x / week    PT Duration  6 weeks    PT Treatment/Interventions  Manual techniques;Neuromuscular re-education;Therapeutic activities;Therapeutic exercise;Patient/family education;Passive range of motion;Dry needling;Aquatic Therapy;Electrical Stimulation;Iontophoresis '4mg'$ /ml Dexamethasone;Ultrasound    PT Next Visit Plan   Can follow [facility] rotator cuff protocol per Carlynn Spry PA-C    Consulted and Agree with Plan of Care  Patient       Patient will benefit from skilled therapeutic intervention in order to improve the following deficits and impairments:  Pain, Postural dysfunction, Improper body mechanics, Impaired UE functional use, Decreased strength, Decreased range of motion  Visit Diagnosis: Right shoulder pain, unspecified chronicity  Muscle weakness (generalized)     Problem List There are no active problems to display for this patient.   Joneen Boers PT, DPT   11/14/2018, 4:13 PM  [facility] [address] [place], [address] Phone: [phone]   Fax:  [phone]  Name: SENAIDA CHILCOTE  MRN: [id number] Date of Birth: [birth year]

## 2018-11-18 ENCOUNTER — Ambulatory Visit: Payer: PRIVATE HEALTH INSURANCE

## 2018-11-19 ENCOUNTER — Ambulatory Visit: Payer: PRIVATE HEALTH INSURANCE

## 2018-11-19 DIAGNOSIS — M6281 Muscle weakness (generalized): Secondary | ICD-10-CM

## 2018-11-19 DIAGNOSIS — M25511 Pain in right shoulder: Secondary | ICD-10-CM | POA: Diagnosis not present

## 2018-11-19 NOTE — Patient Instructions (Signed)
MedbridgeAccess Code: [email or web address]  Single Arm Shoulder Extension with Anchored Resistance  Red band 10x3

## 2018-11-19 NOTE — Therapy (Signed)
[facility] [address] [place], [address] Phone: [phone]   Fax:  [phone]  Physical Therapy Treatment And Progress Report  Patient Details  Name: Alyssa Baird MRN: [id number] Date of Birth: Aug 29, 1946 Referring Provider (PT): Kurtis Bushman, MD   Encounter Date: 11/19/2018  PT End of Session - 11/19/18 0815    Visit Number  36    Number of Visits  44    Date for PT Re-Evaluation  12/12/18    Authorization Type  36    Authorization Time Period  of 41 worker's comp    PT Start Time  [phone]    PT Stop Time  0906    PT Time Calculation (min)  50 min    Activity Tolerance  Patient tolerated treatment well    Behavior During Therapy  [facility] for tasks assessed/performed       Past Medical History:  Diagnosis Date  . Arthritis   . Asthma    only URI  . Hyperlipidemia   . Hypertension     Past Surgical History:  Procedure Laterality Date  . ABDOMINAL HYSTERECTOMY    . CHOLECYSTECTOMY    . SHOULDER ARTHROSCOPY WITH ROTATOR CUFF REPAIR Right 06/11/2018   Procedure: SHOULDER ARTHROSCOPY WITH ROTATOR CUFF REPAIR;  Surgeon: Lovell Sheehan, MD;  Location: [facility] ORS;  Service: Orthopedics;  Laterality: Right;    There were no vitals filed for this visit.  Subjective Assessment - 11/19/18 0817    Subjective  R shoulder was hurting last week. Does not know if its due to the cold weather. No pain currently. The crunching is less, the pain at the clavicle is better.     Pertinent History  S/P R rotator cuff surgery on 06/11/2018. Pt was lifting a bag of linnen at work last year (07/01/2017) which resulted in injury. Thought her shoulder would get better on its own but it got worse.  Had PT at [facility] which did not fix it.   Has not had any PT since her surgery. Was told to perform pendulums at home.  Forgot to use her abduction pillow for her sling.  Also feels pain at the top of her R shoulder blade.  Pt  is R hand dominant.  No falls within the last 6 months.  No fear of falling.     Currently in Pain?  No/denies    Pain Score  0-No pain    Pain Onset  More than a month ago         [facility] PT Assessment - 11/19/18 0902      AROM   Right Shoulder Flexion  85 Degrees   at start of session, 130 degrees in standing after treatment   Right Shoulder ABduction  65 Degrees   degrees scaption at start of session in standing     Strength   Right Shoulder Flexion  4/5    Right Shoulder ABduction  4-/5   at about 60 degrees scaption (otherwise 3-/5 due to ROM)   Right Shoulder Internal Rotation  4+/5    Right Shoulder External Rotation  4+/5                           PT Education - 11/19/18 1010    Education Details  Ther-ex, HEP    Person(s) Educated  Patient    Methods  Explanation;Demonstration;Tactile cues;Verbal cues;Handout    Comprehension  Verbalized understanding;Returned demonstration      Objectives    No latex band allergies  Next MD appointment is 11/21/2018   MedbridgeAccess Code: [email or web address]  R shoulder AROM at start of session: 85 degrees flexion, 65 degrees scaption.   Manual Therapy    Seated STM R distal pectoralis muscle.   Seated STM R upper trap muscle  Seated STM to R teres major muscle   72 degrees R shoulder scaption AROM afterwards    Therapeutic exercise  Standing B shoulder ER (with 3 lb weight at distal arm, elbow flexed to 90 degrees) 10x3 red band  Seated manually resisted R scapular retraction targeting lower trap muscle 10x5 seconds for 3 sets  Seated manually resisted R scapular depression isometrics with PT with scapula in neutral 10x5 seconds for 3 sets  Assisted R shoulder flexion with PT 3x  Then with gentle manual resistance to extension durring assisted flexion and return to neutral motion 5x. Minimal to no crepitus with activation of posterior shoulder muscles  125 degrees R  shoulder flexion AROM afterwards with minimal to no crepitus  Then with gentle manual resistance to extension during flexion and return motion 5x again  130 degrees R shoulder flexion AROM with minimal crepitus afterwards   R shoulder extension with red band attached to top of door 10x2    Improved exercise technique, movement at target joints, use of target muscles after min to mod verbal, visual, tactile cues.   Improved flexion AROM with decreased crepitus following exercises to promote posterior shoulder muscle, infraspinatus and scapular muscle activation. Pt able to achieve up to 130 degrees flexion AROM in standing with minimal crepitus today. Still demonstrates difficulty with scaption AROM. Crepitus with shoulder flexion and scaption began a few weeks ago with steady improvement with treatment. Cues needed as well for proper scapular positioning when raising her arm up. Pt also demonstrates improved R shoulder flexion and IR strength since last measured. Pt still has difficulty with raising her R arm up against gravity, weakness, and difficulty performing functional tasks and would benefit from continued skilled physical therapy services to address the aforementioned deficits.             PT Short Term Goals - 09/19/18 0906      PT SHORT TERM GOAL #1   Title  Patient will be independent with her HEP to promote ability to raise her R arm against gravity and use it for functional tasks when appropriate.     Time  3    Period  Weeks    Status  Achieved    Target Date  09/19/18        PT Long Term Goals - 11/19/18 1010      PT LONG TERM GOAL #1   Title  Patient will improve R shoulder flexion PROM to at least 90 degrees flexion and scaption/abduction to at least 90 degrees to promote ability to raise her R arm, use her R UE to perform functional tasks when appropriate.     Baseline  R shoulder PROM: 75 degrees flexion, 64 degrees abduction (06/25/2018); 90 degrees PROM for  both flexion and scaption (07/17/2018); 140 degrees and 130 degrees (09/19/2018)    Time  6    Period  Weeks    Status  Achieved      PT LONG TERM GOAL #2   Title  Patient will improve R shoulder ER PROM to at least 70 degrees and IR PROM to at least 90 degrees  to promote ability to use her R UE for self care as well as promote ability to don and doff clothes when appropriate.     Baseline  R shoulder PROM in scapular plane: 35 degrees ER, 72 degrees IR (06/25/2018); 54 degrees ER, 76 degrees IR (07/17/2018); 53 degrees ER AAROM, 60 degrees IR AAROM (08/06/2018); 63 degrees ER,  70 degrees IR AAROM (08/29/2018); 66 degrees ER,  60 degrees IR AAROM (09/19/2018);  60 degrees ER, 72 degrees IR AAROM (10/07/2018);  76 degrees ER, 71 degrees IR (10/28/2018)    Time  6    Period  Weeks    Status  Partially Met      PT LONG TERM GOAL #3   Title  Pt will improve her R shoulder FOTO score by at least 20 points as a demonstration of improved function.     Baseline  R shoulder FOTO 4 (06/25/2018); 45 (08/29/2018)    Time  6    Period  Weeks    Status  Achieved      PT LONG TERM GOAL #4   Title  Patient will improve R shoulder flexion and scaption AAROM to 130 degrees or more to promote ability to raise her R arm when appropriate.     Baseline  AAROM not yet performed secondary to PROM for first 6 weeks (07/17/2018); supine AAROM:  115 degrees flexion, 122 degrees scaption (08/06/2018), 121 degrees flexion, 130 degrees scaption (08/29/2018); 121 degrees flexion, 125 degrees scaption (09/19/2018); supine AROM: flexion 146 degrees, scaption 130 degrees (10/07/2018)     Time  6    Period  Weeks    Status  Achieved      PT LONG TERM GOAL #5   Title  Patient will have at least 4/5 R shoulder flexion, scaption, ER and IR strength to promote ability to utilize her R UE to perform funcitonal tasks when appropriate.     Baseline  Strength not tested to allow for more healing (07/17/2018); Not yet tested (08/06/2018); R  shoulder ER and IR at least 4-/5. Shoulder flexion and scaption not yet tested (not yet able to test at 90 degrees flexion or scaption) 08/29/2018); R shoulder ER and IR at least 4/5. Shoulder flexion and scaption tested in available range with lmited resistance (not yet able to test at 90 degrees flexion or scaption 09/19/2018);  flexion 4-/5 (at 90 degrees flexion), scaption 4-/5 (at 80 degrees scaption), IR 4/5, ER 4+/5 (10/28/2018), flexion 4/5, scaption 4-/5 (at about 60 degrees scaption), IR 4+/5, ER 4+/5 (11/19/2018)     Time  6    Period  Weeks    Status  Partially Met    Target Date  12/12/18      PT LONG TERM GOAL #6   Title  Pt will improve her R shoulder FOTO score to at least 60 points as a demonstration of improved function.     Baseline  45 (08/29/2018); 53 (09/19/2018); 55 (10/28/2018)    Time  6    Period  Weeks    Status  Partially Met    Target Date  12/12/18            Plan - 11/19/18 0815    Clinical Impression Statement  Improved flexion AROM with decreased crepitus following exercises to promote posterior shoulder muscle, infraspinatus and scapular muscle activation. Pt able to achieve up to 130 degrees flexion AROM in standing with minimal crepitus today. Still demonstrates difficulty with scaption AROM. Crepitus with shoulder flexion  and scaption began a few weeks ago with steady improvement with treatment. Cues needed as well for proper scapular positioning when raising her arm up. Pt also demonstrates improved R shoulder flexion and IR strength since last measured. Pt still has difficulty with raising her R arm up against gravity, weakness, and difficulty performing functional tasks and would benefit from continued skilled physical therapy services to address the aforementioned deficits.      History and Personal Factors relevant to plan of care:  Chronicity of condition prior to surgery, pain, weakness, difficulty reaching, using her R UE for functional tasks     Clinical Presentation  Stable    Clinical Presentation due to:  improving crepitus    Clinical Decision Making  Low    Rehab Potential  Fair    Clinical Impairments Affecting Rehab Potential  (-) age, chronicity of condition prior to surgery, healing time; (+) motivated    PT Frequency  2x / week    PT Duration  6 weeks    PT Treatment/Interventions  Manual techniques;Neuromuscular re-education;Therapeutic activities;Therapeutic exercise;Patient/family education;Passive range of motion;Dry needling;Aquatic Therapy;Electrical Stimulation;Iontophoresis '4mg'$ /ml Dexamethasone;Ultrasound    PT Next Visit Plan   Can follow [place] rotator cuff protocol per Carlynn Spry PA-C    Consulted and Agree with Plan of Care  Patient       Patient will benefit from skilled therapeutic intervention in order to improve the following deficits and impairments:  Pain, Postural dysfunction, Improper body mechanics, Impaired UE functional use, Decreased strength, Decreased range of motion  Visit Diagnosis: Right shoulder pain, unspecified chronicity  Muscle weakness (generalized)     Problem List There are no active problems to display for this patient.  Thank you for your referral.  Joneen Boers PT, DPT   11/19/2018, 6:52 PM  [facility] [address] [place], [address] Phone: [phone]   Fax:  [phone]  Name: Alyssa Baird MRN: [id number] Date of Birth: 1946/03/23

## 2018-11-21 ENCOUNTER — Ambulatory Visit: Payer: PRIVATE HEALTH INSURANCE

## 2018-11-21 DIAGNOSIS — M25511 Pain in right shoulder: Secondary | ICD-10-CM

## 2018-11-21 DIAGNOSIS — M6281 Muscle weakness (generalized): Secondary | ICD-10-CM

## 2018-11-21 NOTE — Patient Instructions (Signed)
R shoulder return motion from scaption resisting red band 10x2. Band attached to top of door  Reviewed and given as part of her HEP. Pt demonstrated and verbalized understanding

## 2018-11-21 NOTE — Therapy (Signed)
[facility] [address] [place], [address] Phone: [phone]   Fax:  [phone]  Physical Therapy Treatment  Patient Details  Name: Alyssa Baird MRN: [id number] Date of Birth: 18-Nov-1946 Referring Provider (PT): Kurtis Bushman, MD   Encounter Date: 11/21/2018  PT End of Session - 11/21/18 1600    Visit Number  37    Number of Visits  44    Date for PT Re-Evaluation  12/12/18    Authorization Type  37    Authorization Time Period  of 54 worker's comp    PT Start Time  1600    PT Stop Time  1646    PT Time Calculation (min)  46 min    Activity Tolerance  Patient tolerated treatment well    Behavior During Therapy  WFL for tasks assessed/performed       Past Medical History:  Diagnosis Date  . Arthritis   . Asthma    only URI  . Hyperlipidemia   . Hypertension     Past Surgical History:  Procedure Laterality Date  . ABDOMINAL HYSTERECTOMY    . CHOLECYSTECTOMY    . SHOULDER ARTHROSCOPY WITH ROTATOR CUFF REPAIR Right 06/11/2018   Procedure: SHOULDER ARTHROSCOPY WITH ROTATOR CUFF REPAIR;  Surgeon: Lovell Sheehan, MD;  Location: [facility] ORS;  Service: Orthopedics;  Laterality: Right;    There were no vitals filed for this visit.  Subjective Assessment - 11/21/18 1604    Subjective  R shoulder is doing good today. The doctor appointment went well. Still on light duty. The doctor is trying work conditioning.      Pertinent History  S/P R rotator cuff surgery on 06/11/2018. Pt was lifting a bag of linnen at work last year (07/01/2017) which resulted in injury. Thought her shoulder would get better on its own but it got worse.  Had PT at [facility] which did not fix it.   Has not had any PT since her surgery. Was told to perform pendulums at home.  Forgot to use her abduction pillow for her sling.  Also feels pain at the top of her R shoulder blade.  Pt is R hand dominant.  No falls within the last 6  months.  No fear of falling.     Currently in Pain?  No/denies    Pain Score  0-No pain    Pain Onset  More than a month ago                               PT Education - 11/21/18 1750    Education Details  ther-ex, HEP    Person(s) Educated  Patient    Methods  Explanation;Demonstration;Tactile cues;Verbal cues    Comprehension  Returned demonstration;Verbalized understanding        Objectives    No latex band allergies  Per nurse case manager: pt to perform PT 1x this week, then 1x next week, then she'll do work conditioning at [facility] [employer]   11/28/2018 last scheduled appointment    MedbridgeAccess Code: [email or web address]  R shoulder AROM at start of session: 115 degrees flexion, 92 degrees scaption.   Manual Therapy  Seated STM R distal pectoralis muscle.   Seated STM R upper trap muscle  Seated STM to R teres major muscle     118 degrees R shoulder flexion, 104 degrees scaption AROM afterwards  Therapeutic exercise  R shoulder flexion and scaption AROM multiple times during session  R shoulder scaption with gentle manual resistance to extension/return motion 5x2. No crepitus  R shoulder flexion with gentle manual resistance to extension 5x2  Increased time secondary to emphasis on quality of movement.  No crepitus    135 degrees flexion AROM  135 degrees scaption AROM after aforementioned exercises  R shoulder return motion from scaption resisting red band 10x2. Band attached to top of door  Reviewed and given as part of her HEP. Pt demonstrated and verbalized understanding   R shoulder extension with red band attached to top of door 10x2  Manually resisted R scapular retraction targeting lower trap muscle 10x5 seconds for 2 sets    Improved exercise technique, movement at target joints, use of target muscles after min to mod verbal, visual, tactile cues.   Able to perform R shoulder flexion and  scaption up to 135 degrees after exercises to promote activation of posterior shoulder muscles. Smooth motion, no crepitus palpated or heard. Pt making progress with R shoulder AROM. Pt will benefit from continued skilled physical therapy services to improve ability to raise her arm, improve strength and function.           PT Short Term Goals - 09/19/18 0906      PT SHORT TERM GOAL #1   Title  Patient will be independent with her HEP to promote ability to raise her R arm against gravity and use it for functional tasks when appropriate.     Time  3    Period  Weeks    Status  Achieved    Target Date  09/19/18        PT Long Term Goals - 11/19/18 1010      PT LONG TERM GOAL #1   Title  Patient will improve R shoulder flexion PROM to at least 90 degrees flexion and scaption/abduction to at least 90 degrees to promote ability to raise her R arm, use her R UE to perform functional tasks when appropriate.     Baseline  R shoulder PROM: 75 degrees flexion, 64 degrees abduction (06/25/2018); 90 degrees PROM for both flexion and scaption (07/17/2018); 140 degrees and 130 degrees (09/19/2018)    Time  6    Period  Weeks    Status  Achieved      PT LONG TERM GOAL #2   Title  Patient will improve R shoulder ER PROM to at least 70 degrees and IR PROM to at least 90 degrees to promote ability to use her R UE for self care as well as promote ability to don and doff clothes when appropriate.     Baseline  R shoulder PROM in scapular plane: 35 degrees ER, 72 degrees IR (06/25/2018); 54 degrees ER, 76 degrees IR (07/17/2018); 53 degrees ER AAROM, 60 degrees IR AAROM (08/06/2018); 63 degrees ER,  70 degrees IR AAROM (08/29/2018); 66 degrees ER,  60 degrees IR AAROM (09/19/2018);  60 degrees ER, 72 degrees IR AAROM (10/07/2018);  76 degrees ER, 71 degrees IR (10/28/2018)    Time  6    Period  Weeks    Status  Partially Met      PT LONG TERM GOAL #3   Title  Pt will improve her R shoulder FOTO score by at  least 20 points as a demonstration of improved function.     Baseline  R shoulder FOTO 4 (06/25/2018); 45 (08/29/2018)    Time  6    Period  Weeks    Status  Achieved      PT LONG TERM GOAL #4   Title  Patient will improve R shoulder flexion and scaption AAROM to 130 degrees or more to promote ability to raise her R arm when appropriate.     Baseline  AAROM not yet performed secondary to PROM for first 6 weeks (07/17/2018); supine AAROM:  115 degrees flexion, 122 degrees scaption (08/06/2018), 121 degrees flexion, 130 degrees scaption (08/29/2018); 121 degrees flexion, 125 degrees scaption (09/19/2018); supine AROM: flexion 146 degrees, scaption 130 degrees (10/07/2018)     Time  6    Period  Weeks    Status  Achieved      PT LONG TERM GOAL #5   Title  Patient will have at least 4/5 R shoulder flexion, scaption, ER and IR strength to promote ability to utilize her R UE to perform funcitonal tasks when appropriate.     Baseline  Strength not tested to allow for more healing (07/17/2018); Not yet tested (08/06/2018); R shoulder ER and IR at least 4-/5. Shoulder flexion and scaption not yet tested (not yet able to test at 90 degrees flexion or scaption) 08/29/2018); R shoulder ER and IR at least 4/5. Shoulder flexion and scaption tested in available range with lmited resistance (not yet able to test at 90 degrees flexion or scaption 09/19/2018);  flexion 4-/5 (at 90 degrees flexion), scaption 4-/5 (at 80 degrees scaption), IR 4/5, ER 4+/5 (10/28/2018), flexion 4/5, scaption 4-/5 (at about 60 degrees scaption), IR 4+/5, ER 4+/5 (11/19/2018)     Time  6    Period  Weeks    Status  Partially Met    Target Date  12/12/18      PT LONG TERM GOAL #6   Title  Pt will improve her R shoulder FOTO score to at least 60 points as a demonstration of improved function.     Baseline  45 (08/29/2018); 53 (09/19/2018); 55 (10/28/2018)    Time  6    Period  Weeks    Status  Partially Met    Target Date  12/12/18             Plan - 11/21/18 1559    Clinical Impression Statement  Able to perform R shoulder flexion and scaption up to 135 degrees after exercises to promote activation of posterior shoulder muscles. Smooth motion, no crepitus palpated or heard. Pt making progress with R shoulder AROM. Pt will benefit from continued skilled physical therapy services to improve ability to raise her arm, improve strength and function.      Rehab Potential  Fair    Clinical Impairments Affecting Rehab Potential  (-) age, chronicity of condition prior to surgery, healing time; (+) motivated    PT Frequency  2x / week    PT Duration  6 weeks    PT Treatment/Interventions  Manual techniques;Neuromuscular re-education;Therapeutic activities;Therapeutic exercise;Patient/family education;Passive range of motion;Dry needling;Aquatic Therapy;Electrical Stimulation;Iontophoresis 65m/ml Dexamethasone;Ultrasound    PT Next Visit Plan   [place] per MCarlynn SpryPA-C    Consulted and Agree with Plan of Care  Patient       Patient will benefit from skilled therapeutic intervention in order to improve the following deficits and impairments:  Pain, Postural dysfunction, Improper body mechanics, Impaired UE functional use, Decreased strength, Decreased range of motion  Visit Diagnosis: Right shoulder pain, unspecified chronicity  Muscle weakness (generalized)  Problem List There are no active problems to display for this patient.   Joneen Boers PT, DPT   11/21/2018, 5:55 PM  [facility] [address], [place], [address] Phone: [phone]   Fax:  [phone]  Name: Alyssa Baird MRN: [id number] Date of Birth: 02-28-1946

## 2018-11-25 ENCOUNTER — Ambulatory Visit: Payer: PRIVATE HEALTH INSURANCE

## 2018-11-28 ENCOUNTER — Ambulatory Visit: Payer: PRIVATE HEALTH INSURANCE

## 2018-11-28 DIAGNOSIS — M6281 Muscle weakness (generalized): Secondary | ICD-10-CM

## 2018-11-28 DIAGNOSIS — M25511 Pain in right shoulder: Secondary | ICD-10-CM | POA: Diagnosis not present

## 2018-11-28 NOTE — Patient Instructions (Addendum)
Gave green band for pt for when her R shoulder extensionwithred bandattached totop of door exercise gets easier. Pt to cut down repetitions in half when pt upgrades to a green band due to increased difficulty and work back up to sets of 10. Pt demonstrated and verbalized understanding.

## 2018-11-28 NOTE — Therapy (Signed)
[facility] [address], [place], [address] Phone: [phone]   Fax:  [phone]  Physical Therapy Treatment And Discharge Summary  Patient Details  Name: Alyssa Baird MRN: [id number] Date of Birth: 1946/03/23 Referring Provider (PT): Kurtis Bushman, MD   Encounter Date: 11/28/2018  PT End of Session - 11/28/18 1259    Visit Number  38    Number of Visits  44    Date for PT Re-Evaluation  12/12/18    Authorization Type  38    Authorization Time Period  of 69 worker's comp    PT Start Time  1259    PT Stop Time  1345    PT Time Calculation (min)  46 min    Activity Tolerance  Patient tolerated treatment well    Behavior During Therapy  WFL for tasks assessed/performed       Past Medical History:  Diagnosis Date  . Arthritis   . Asthma    only URI  . Hyperlipidemia   . Hypertension     Past Surgical History:  Procedure Laterality Date  . ABDOMINAL HYSTERECTOMY    . CHOLECYSTECTOMY    . SHOULDER ARTHROSCOPY WITH ROTATOR CUFF REPAIR Right 06/11/2018   Procedure: SHOULDER ARTHROSCOPY WITH ROTATOR CUFF REPAIR;  Surgeon: Lovell Sheehan, MD;  Location: [facility] ORS;  Service: Orthopedics;  Laterality: Right;    There were no vitals filed for this visit.  Subjective Assessment - 11/28/18 1300    Subjective  Pt states that her shoulder is grinding again. No R shoulder pain. Yesterday, the grinding was not bad.     Pertinent History  S/P R rotator cuff surgery on 06/11/2018. Pt was lifting a bag of linnen at work last year (07/01/2017) which resulted in injury. Thought her shoulder would get better on its own but it got worse.  Had PT at [facility] which did not fix it.   Has not had any PT since her surgery. Was told to perform pendulums at home.  Forgot to use her abduction pillow for her sling.  Also feels pain at the top of her R shoulder blade.  Pt is R hand dominant.  No falls within the last 6  months.  No fear of falling.     Currently in Pain?  No/denies    Pain Score  0-No pain    Pain Onset  More than a month ago         [facility] PT Assessment - 11/28/18 1324      Observation/Other Assessments   Focus on Therapeutic Outcomes (FOTO)   62      Strength   Right Shoulder Flexion  4/5    Right Shoulder ABduction  4-/5   at 90 degrees scaption   Right Shoulder Internal Rotation  4+/5    Right Shoulder External Rotation  4+/5                           PT Education - 11/28/18 1304    Education Details  ther-ex    Person(s) Educated  Patient    Methods  Explanation;Demonstration;Tactile cues;Verbal cues    Comprehension  Returned demonstration;Verbalized understanding      Objectives    No latex band allergies  Per nurse case manager: pt to perform PT 1x this week, then 1x next week, then she'll do work conditioning at [employer] [place]:  [id number]  R shoulder AROM at start of session: 128 degrees flexion, 116 degrees scaption.     Therapeutic exercise  R shoulder extensionwithred bandattached totop of door 10x2   R shoulder flexion and scaption AROM multiple times during session  R shoulder scaption with gentle manual resistance to extension/return motion 5x3  R shoulder flexion with gentle manual resistance to extension 5x3  Increased time secondary to emphasis on quality of movement.  Decreased crepitus afterwards   136 degrees flexion AROM  131 degrees scaption AROM after aforementioned exercises  Manually resisted R shoulder flexion, scaption, ER, IR1x each way  Manually resisted R scapular retraction targeting lower trap muscle 10x5 seconds for 2 sets    Improved exercise technique, movement at target joints, use of target muscles after min to mod verbal, visual, tactile cues.    Manual Therapy  Seated STM R distal pectoralis muscle.   Seated STM R upper trap  muscle  Seated STM to R teres major muscle    Patient demonstrates overall improved R shoulder AROM, strength and function since initial evaluation. Decreased crepitus with shoulder flexion and scaption movements following treatment to increase posterior shoulder strength. Pt has made progress with physical therapy towards goals. Skilled physical therapy services discharged with patient continuing progress with her HEP with with pt continuing with a work hardening/conditioning program per case nurse.        PT Short Term Goals - 09/19/18 0906      PT SHORT TERM GOAL #1   Title  Patient will be independent with her HEP to promote ability to raise her R arm against gravity and use it for functional tasks when appropriate.     Time  3    Period  Weeks    Status  Achieved    Target Date  09/19/18        PT Long Term Goals - 11/28/18 1652      PT LONG TERM GOAL #1   Title  Patient will improve R shoulder flexion PROM to at least 90 degrees flexion and scaption/abduction to at least 90 degrees to promote ability to raise her R arm, use her R UE to perform functional tasks when appropriate.     Baseline  R shoulder PROM: 75 degrees flexion, 64 degrees abduction (06/25/2018); 90 degrees PROM for both flexion and scaption (07/17/2018); 140 degrees and 130 degrees (09/19/2018)    Time  6    Period  Weeks    Status  Achieved      PT LONG TERM GOAL #2   Title  Patient will improve R shoulder ER PROM to at least 70 degrees and IR PROM to at least 90 degrees to promote ability to use her R UE for self care as well as promote ability to don and doff clothes when appropriate.     Baseline  R shoulder PROM in scapular plane: 35 degrees ER, 72 degrees IR (06/25/2018); 54 degrees ER, 76 degrees IR (07/17/2018); 53 degrees ER AAROM, 60 degrees IR AAROM (08/06/2018); 63 degrees ER,  70 degrees IR AAROM (08/29/2018); 66 degrees ER,  60 degrees IR AAROM (09/19/2018);  60 degrees ER, 72 degrees IR AAROM  (10/07/2018);  76 degrees ER, 71 degrees IR (10/28/2018)    Time  6    Period  Weeks    Status  Partially Met    Target Date  12/12/18      PT LONG TERM GOAL #3   Title  Pt will improve her  R shoulder FOTO score by at least 20 points as a demonstration of improved function.     Baseline  R shoulder FOTO 4 (06/25/2018); 45 (08/29/2018)    Time  6    Period  Weeks    Status  Achieved      PT LONG TERM GOAL #4   Title  Patient will improve R shoulder flexion and scaption AAROM to 130 degrees or more to promote ability to raise her R arm when appropriate.     Baseline  AAROM not yet performed secondary to PROM for first 6 weeks (07/17/2018); supine AAROM:  115 degrees flexion, 122 degrees scaption (08/06/2018), 121 degrees flexion, 130 degrees scaption (08/29/2018); 121 degrees flexion, 125 degrees scaption (09/19/2018); supine AROM: flexion 146 degrees, scaption 130 degrees (10/07/2018)     Time  6    Period  Weeks    Status  Achieved      PT LONG TERM GOAL #5   Title  Patient will have at least 4/5 R shoulder flexion, scaption, ER and IR strength to promote ability to utilize her R UE to perform funcitonal tasks when appropriate.     Baseline  Strength not tested to allow for more healing (07/17/2018); Not yet tested (08/06/2018); R shoulder ER and IR at least 4-/5. Shoulder flexion and scaption not yet tested (not yet able to test at 90 degrees flexion or scaption) 08/29/2018); R shoulder ER and IR at least 4/5. Shoulder flexion and scaption tested in available range with lmited resistance (not yet able to test at 90 degrees flexion or scaption 09/19/2018);  flexion 4-/5 (at 90 degrees flexion), scaption 4-/5 (at 80 degrees scaption), IR 4/5, ER 4+/5 (10/28/2018), flexion 4/5, scaption 4-/5 (at about 60 degrees scaption), IR 4+/5, ER 4+/5 (11/19/2018), (11/28/2018)    Time  6    Period  Weeks    Status  Partially Met    Target Date  12/12/18      PT LONG TERM GOAL #6   Title  Pt will improve her R  shoulder FOTO score to at least 60 points as a demonstration of improved function.     Baseline  45 (08/29/2018); 53 (09/19/2018); 55 (10/28/2018); 62 (11/28/2018)    Time  6    Period  Weeks    Status  Achieved    Target Date  12/12/18            Plan - 11/28/18 1257    Clinical Impression Statement  Patient demonstrates overall improved R shoulder AROM, strength and function since initial evaluation. Decreased crepitus with shoulder flexion and scaption movements following treatment to increase posterior shoulder strength. Pt has made progress with physical therapy towards goals. Skilled physical therapy services discharged with patient continuing progress with her HEP with with pt continuing with a work hardening/conditioning program per case nurse.     History and Personal Factors relevant to plan of care:  Chronicity of condition prior to surgery, pain, weakness    Clinical Presentation  Stable    Clinical Presentation due to:  pt made progress towards goals    Clinical Decision Making  Low    Rehab Potential  Fair    Clinical Impairments Affecting Rehab Potential  (-) age, chronicity of condition prior to surgery, healing time; (+) motivated    PT Frequency  --    PT Duration  --    PT Treatment/Interventions  Manual techniques;Neuromuscular re-education;Therapeutic activities;Therapeutic exercise;Patient/family education;Passive range of motion    PT Next  Visit Plan  Continue progress with HEP. Pt to also continue with work hardening/conditioning per case nurse    Consulted and Agree with Plan of Care  Patient       Patient will benefit from skilled therapeutic intervention in order to improve the following deficits and impairments:  Pain, Postural dysfunction, Improper body mechanics, Impaired UE functional use, Decreased strength, Decreased range of motion  Visit Diagnosis: Right shoulder pain, unspecified chronicity  Muscle weakness (generalized)     Problem  List There are no active problems to display for this patient.   Thank you for your referral.   Joneen Boers PT, DPT   11/28/2018, 5:01 PM  [facility] [address], [place], [address] Phone: [phone]   Fax:  [phone]  Name: Alyssa Baird MRN: [id number] Date of Birth: 1946/05/29

## 2019-01-29 ENCOUNTER — Other Ambulatory Visit: Payer: Self-pay | Admitting: Internal Medicine

## 2019-01-29 DIAGNOSIS — Z0000 Encounter for general adult medical examination without abnormal findings: Secondary | ICD-10-CM | POA: Diagnosis not present

## 2019-01-29 DIAGNOSIS — Z1231 Encounter for screening mammogram for malignant neoplasm of breast: Secondary | ICD-10-CM

## 2019-01-30 ENCOUNTER — Other Ambulatory Visit
Admission: RE | Admit: 2019-01-30 | Discharge: 2019-01-30 | Disposition: A | Discharge status: Home / Self Care | Payer: 59 | Source: Ambulatory Visit | Attending: Internal Medicine | Admitting: Internal Medicine

## 2019-01-30 DIAGNOSIS — E785 Hyperlipidemia, unspecified: Secondary | ICD-10-CM | POA: Insufficient documentation

## 2019-01-30 DIAGNOSIS — I10 Essential (primary) hypertension: Secondary | ICD-10-CM | POA: Insufficient documentation

## 2019-01-30 LAB — CBC WITH DIFFERENTIAL/PLATELET
Abs Immature Granulocytes: 0.02 10*3/uL (ref 0.00–0.07)
Basophils Absolute: 0 10*3/uL (ref 0.0–0.1)
Basophils Relative: 0 %
Eosinophils Absolute: 0.3 10*3/uL (ref 0.0–0.5)
Eosinophils Relative: 5 %
HCT: 40.6 % (ref 36.0–46.0)
Hemoglobin: 12.8 g/dL (ref 12.0–15.0)
Immature Granulocytes: 0 %
Lymphocytes Relative: 36 %
Lymphs Abs: 2.2 10*3/uL (ref 0.7–4.0)
MCH: 30.3 pg (ref 26.0–34.0)
MCHC: 31.5 g/dL (ref 30.0–36.0)
MCV: 96 fL (ref 80.0–100.0)
Monocytes Absolute: 0.5 10*3/uL (ref 0.1–1.0)
Monocytes Relative: 9 %
Neutro Abs: 3.1 10*3/uL (ref 1.7–7.7)
Neutrophils Relative %: 50 %
Platelets: 177 10*3/uL (ref 150–400)
RBC: 4.23 MIL/uL (ref 3.87–5.11)
RDW: 13.1 % (ref 11.5–15.5)
WBC: 6.1 10*3/uL (ref 4.0–10.5)
nRBC: 0 % (ref 0.0–0.2)

## 2019-01-30 LAB — COMPREHENSIVE METABOLIC PANEL
ALT: 17 U/L (ref 0–44)
AST: 43 U/L — ABNORMAL HIGH (ref 15–41)
Albumin: 3.9 g/dL (ref 3.5–5.0)
Alkaline Phosphatase: 83 U/L (ref 38–126)
Anion gap: 7 (ref 5–15)
BUN: 20 mg/dL (ref 8–23)
CO2: 30 mmol/L (ref 22–32)
Calcium: 9.4 mg/dL (ref 8.9–10.3)
Chloride: 105 mmol/L (ref 98–111)
Creatinine, Ser: 0.76 mg/dL (ref 0.44–1.00)
GFR calc Af Amer: >60 mL/min (ref >60)
GFR calc non Af Amer: >60 mL/min (ref >60)
Glucose, Bld: 105 mg/dL — ABNORMAL HIGH (ref 70–99)
Potassium: 3.9 mmol/L (ref 3.5–5.1)
Sodium: 142 mmol/L (ref 135–145)
Total Bilirubin: 0.8 mg/dL (ref 0.3–1.2)
Total Protein: 7.4 g/dL (ref 6.5–8.1)

## 2019-01-30 LAB — LIPID PANEL
Cholesterol: 204 mg/dL — ABNORMAL HIGH (ref 0–200)
HDL: 54 mg/dL (ref >40)
LDL Cholesterol: 112 mg/dL — ABNORMAL HIGH (ref 0–99)
Total CHOL/HDL Ratio: 3.8 RATIO
Triglycerides: 188 mg/dL — ABNORMAL HIGH (ref <150)
VLDL: 38 mg/dL (ref 0–40)

## 2019-01-30 LAB — TSH: TSH: 0.522 u[IU]/mL (ref 0.350–4.500)

## 2019-02-10 DIAGNOSIS — Z1212 Encounter for screening for malignant neoplasm of rectum: Secondary | ICD-10-CM | POA: Diagnosis not present

## 2019-02-10 DIAGNOSIS — Z1211 Encounter for screening for malignant neoplasm of colon: Secondary | ICD-10-CM | POA: Diagnosis not present

## 2019-05-07 ENCOUNTER — Other Ambulatory Visit: Payer: Self-pay

## 2019-05-07 ENCOUNTER — Ambulatory Visit
Admission: RE | Admit: 2019-05-07 | Discharge: 2019-05-07 | Disposition: A | Discharge status: Home / Self Care | Payer: 59 | Source: Ambulatory Visit | Attending: Internal Medicine | Admitting: Internal Medicine

## 2019-05-07 DIAGNOSIS — Z1231 Encounter for screening mammogram for malignant neoplasm of breast: Secondary | ICD-10-CM | POA: Diagnosis not present

## 2019-05-27 DIAGNOSIS — I10 Essential (primary) hypertension: Secondary | ICD-10-CM | POA: Diagnosis not present

## 2019-05-27 DIAGNOSIS — E785 Hyperlipidemia, unspecified: Secondary | ICD-10-CM | POA: Diagnosis not present

## 2019-06-23 DIAGNOSIS — M25569 Pain in unspecified knee: Secondary | ICD-10-CM | POA: Diagnosis not present

## 2019-07-07 DIAGNOSIS — M25562 Pain in left knee: Secondary | ICD-10-CM | POA: Diagnosis not present

## 2019-07-07 DIAGNOSIS — M1712 Unilateral primary osteoarthritis, left knee: Secondary | ICD-10-CM | POA: Diagnosis not present

## 2019-07-15 DIAGNOSIS — M25562 Pain in left knee: Secondary | ICD-10-CM | POA: Diagnosis not present

## 2019-08-12 DIAGNOSIS — M25562 Pain in left knee: Secondary | ICD-10-CM | POA: Diagnosis not present

## 2019-09-23 DIAGNOSIS — Z9889 Other specified postprocedural states: Secondary | ICD-10-CM | POA: Diagnosis not present

## 2019-09-23 DIAGNOSIS — M25511 Pain in right shoulder: Secondary | ICD-10-CM | POA: Diagnosis not present

## 2019-09-23 DIAGNOSIS — M25562 Pain in left knee: Secondary | ICD-10-CM | POA: Diagnosis not present

## 2019-09-30 DIAGNOSIS — I10 Essential (primary) hypertension: Secondary | ICD-10-CM | POA: Diagnosis not present

## 2020-01-02 ENCOUNTER — Other Ambulatory Visit: Payer: Self-pay | Admitting: Internal Medicine

## 2020-01-02 DIAGNOSIS — Z1231 Encounter for screening mammogram for malignant neoplasm of breast: Secondary | ICD-10-CM

## 2020-02-24 ENCOUNTER — Other Ambulatory Visit
Admission: RE | Admit: 2020-02-24 | Discharge: 2020-02-24 | Disposition: A | Discharge status: Home / Self Care | Payer: 59 | Source: Ambulatory Visit | Attending: Internal Medicine | Admitting: Internal Medicine

## 2020-02-24 DIAGNOSIS — I10 Essential (primary) hypertension: Secondary | ICD-10-CM | POA: Insufficient documentation

## 2020-02-24 DIAGNOSIS — E785 Hyperlipidemia, unspecified: Secondary | ICD-10-CM | POA: Diagnosis not present

## 2020-02-24 LAB — CBC WITH DIFFERENTIAL/PLATELET
Abs Immature Granulocytes: 0.02 10*3/uL (ref 0.00–0.07)
Basophils Absolute: 0 10*3/uL (ref 0.0–0.1)
Basophils Relative: 1 %
Eosinophils Absolute: 0.3 10*3/uL (ref 0.0–0.5)
Eosinophils Relative: 4 %
HCT: 41.1 % (ref 36.0–46.0)
Hemoglobin: 13.2 g/dL (ref 12.0–15.0)
Immature Granulocytes: 0 %
Lymphocytes Relative: 36 %
Lymphs Abs: 2.4 10*3/uL (ref 0.7–4.0)
MCH: 30.9 pg (ref 26.0–34.0)
MCHC: 32.1 g/dL (ref 30.0–36.0)
MCV: 96.3 fL (ref 80.0–100.0)
Monocytes Absolute: 0.5 10*3/uL (ref 0.1–1.0)
Monocytes Relative: 7 %
Neutro Abs: 3.5 10*3/uL (ref 1.7–7.7)
Neutrophils Relative %: 52 %
Platelets: 181 10*3/uL (ref 150–400)
RBC: 4.27 MIL/uL (ref 3.87–5.11)
RDW: 13.2 % (ref 11.5–15.5)
WBC: 6.6 10*3/uL (ref 4.0–10.5)
nRBC: 0 % (ref 0.0–0.2)

## 2020-02-24 LAB — COMPREHENSIVE METABOLIC PANEL
ALT: 17 U/L (ref 0–44)
AST: 36 U/L (ref 15–41)
Albumin: 3.9 g/dL (ref 3.5–5.0)
Alkaline Phosphatase: 70 U/L (ref 38–126)
Anion gap: 11 (ref 5–15)
BUN: 30 mg/dL — ABNORMAL HIGH (ref 8–23)
CO2: 27 mmol/L (ref 22–32)
Calcium: 9.3 mg/dL (ref 8.9–10.3)
Chloride: 103 mmol/L (ref 98–111)
Creatinine, Ser: 0.89 mg/dL (ref 0.44–1.00)
GFR calc Af Amer: >60 mL/min (ref >60)
GFR calc non Af Amer: >60 mL/min (ref >60)
Glucose, Bld: 109 mg/dL — ABNORMAL HIGH (ref 70–99)
Potassium: 3.4 mmol/L — ABNORMAL LOW (ref 3.5–5.1)
Sodium: 141 mmol/L (ref 135–145)
Total Bilirubin: 0.9 mg/dL (ref 0.3–1.2)
Total Protein: 7.4 g/dL (ref 6.5–8.1)

## 2020-02-24 LAB — LIPID PANEL
Cholesterol: 263 mg/dL — ABNORMAL HIGH (ref 0–200)
HDL: 66 mg/dL (ref >40)
LDL Cholesterol: 149 mg/dL — ABNORMAL HIGH (ref 0–99)
Total CHOL/HDL Ratio: 4 RATIO
Triglycerides: 238 mg/dL — ABNORMAL HIGH (ref <150)
VLDL: 48 mg/dL — ABNORMAL HIGH (ref 0–40)

## 2020-02-24 LAB — TSH: TSH: 0.584 u[IU]/mL (ref 0.350–4.500)

## 2020-03-09 DIAGNOSIS — Z0000 Encounter for general adult medical examination without abnormal findings: Secondary | ICD-10-CM | POA: Diagnosis not present

## 2020-03-28 ENCOUNTER — Ambulatory Visit
Admission: EM | Admit: 2020-03-28 | Discharge: 2020-03-28 | Disposition: A | Discharge status: Home / Self Care | Payer: 59 | Attending: Family Medicine | Admitting: Family Medicine

## 2020-03-28 ENCOUNTER — Other Ambulatory Visit: Payer: Self-pay

## 2020-03-28 DIAGNOSIS — R220 Localized swelling, mass and lump, head: Secondary | ICD-10-CM

## 2020-03-28 DIAGNOSIS — K029 Dental caries, unspecified: Secondary | ICD-10-CM | POA: Diagnosis not present

## 2020-03-28 DIAGNOSIS — I10 Essential (primary) hypertension: Secondary | ICD-10-CM | POA: Diagnosis not present

## 2020-03-28 DIAGNOSIS — K05219 Aggressive periodontitis, localized, unspecified severity: Secondary | ICD-10-CM

## 2020-03-28 MED ORDER — CLINDAMYCIN HCL 300 MG PO CAPS: 300.0000 mg | ORAL_CAPSULE | Freq: Three times a day (TID) | ORAL | 0 refills | Status: AC

## 2020-03-28 MED ORDER — TRAMADOL HCL 50 MG PO TABS: 50.0000 mg | ORAL_TABLET | Freq: Three times a day (TID) | ORAL | 0 refills | Status: AC | PRN

## 2020-03-28 MED ORDER — NAPROXEN 500 MG PO TABS: 500.0000 mg | ORAL_TABLET | Freq: Two times a day (BID) | ORAL | 0 refills | Status: AC | PRN

## 2020-03-28 NOTE — ED Triage Notes (Addendum)
Pt presents with c/o lip/facial swelling, left side upper lip, since Thursday. Pt thinks she may have abscess. She denies any dental pain. She does think she may have a cavity in the tooth close to the swelling. She reports some possible pus-like drainage from the gumline of her front tooth.

## 2020-03-28 NOTE — ED Provider Notes (Signed)
[facility]    CSN: [id number] Arrival date & time: 03/28/20  1015      History   Chief Complaint Chief Complaint  Patient presents with  . Facial Swelling    HPI Alyssa Baird is a 74 y.o. female.   74 year old female presents with left upper lip and face swelling that started 3 days ago. She had noticed that her front teeth were sore earlier in the week and may have a cavity. Then noticed the gum above her front teeth was very red and swollen and had caused her left side of her upper lip to swell and be painful. Yesterday and today she noticed yellowish pus draining from the upper gum area. Now skin just below left side of her nostril and left upper lip is very painful and tender. She denies any fever, chills, nasal congestion, nausea or vomiting. She has taken Ibuprofen 800mg  and used salt water and hydrogen peroxide mouth rinses with minimal relief. Has history of poor dentition and most of her teeth have been pulled. Other concern is that her blood pressure is extremely elevated today. Denies any headache, vision changes, dizziness, chest pain, difficulty breathing or numbness. Other chronic health issues include HTN, hyperlipidemia, GERD, environmental allergies, and glaucoma. Currently takes Metoprolol, Valsartan-HCTZ, Lovastatin, Prilosec, Claritin, aspirin and Xalatan eye drops daily.   The history is provided by the patient.    Past Medical History:  Diagnosis Date  . Arthritis   . Asthma    only URI  . Hyperlipidemia   . Hypertension     There are no problems to display for this patient.   Past Surgical History:  Procedure Laterality Date  . ABDOMINAL HYSTERECTOMY    . CHOLECYSTECTOMY    . SHOULDER ARTHROSCOPY WITH ROTATOR CUFF REPAIR Right 06/11/2018   Procedure: SHOULDER ARTHROSCOPY WITH ROTATOR CUFF REPAIR;  Surgeon: Lovell Sheehan, MD;  Location: [facility];  Service: Orthopedics;  Laterality: Right;    OB History   No obstetric history on  file.      Home Medications    Prior to Admission medications   Medication Sig Start Date End Date Taking? Authorizing Provider  acetaminophen (TYLENOL) 500 MG tablet Take 1,000 mg by mouth every 6 (six) hours as needed (for pain.).   Yes [provider]  albuterol (PROVENTIL HFA;VENTOLIN HFA) 108 (90 Base) MCG/ACT inhaler Inhale 2 puffs into the lungs every 6 (six) hours as needed for wheezing or shortness of breath. 01/02/18  Yes Okonkwo, Justina A, NP  aspirin EC 81 MG tablet Take 81 mg by mouth daily.   Yes [provider]  Cholecalciferol (D3 DOTS) 2000 units TBDP Take 2,000 Units by mouth daily.   Yes [provider]  latanoprost (XALATAN) 0.005 % ophthalmic solution Place 1 drop into both eyes at bedtime.   Yes [provider]  loratadine (CLARITIN) 10 MG tablet Take 10 mg by mouth daily as needed for allergies.   Yes [provider]  lovastatin (MEVACOR) 40 MG tablet Take 40 mg by mouth every evening. 04/30/18  Yes [provider]  metoprolol tartrate (LOPRESSOR) 50 MG tablet Take 50 mg by mouth 2 (two) times daily. 05/14/18  Yes [provider]  omeprazole (PRILOSEC) 40 MG capsule Take 40 mg by mouth daily before breakfast. 05/14/18  Yes [provider]  valsartan-hydrochlorothiazide (DIOVAN-HCT) 160-25 MG tablet Take 1 tablet by mouth daily. 02/20/18  Yes [provider]  vitamin B-12 (CYANOCOBALAMIN) 500 MCG tablet  Take 500 mcg by mouth daily.   Yes [provider]  vitamin C (ASCORBIC ACID) 500 MG tablet Take 500 mg by mouth daily.   Yes [provider]  clindamycin (CLEOCIN) 300 MG capsule Take 1 capsule (300 mg total) by mouth 3 (three) times daily for 10 days. 03/28/20 04/07/20  Sudie Grumbling, NP  naproxen (NAPROSYN) 500 MG tablet Take 1 tablet (500 mg total) by mouth 2 (two) times daily as needed for moderate pain. 03/28/20   Sudie Grumbling, NP  traMADol (ULTRAM) 50 MG tablet Take 1  tablet (50 mg total) by mouth every 8 (eight) hours as needed for severe pain. 03/28/20   Sudie Grumbling, NP    Family History Family History  Problem Relation Age of Onset  . Breast cancer Sister 57    Social History Social History   Tobacco Use  . Smoking status: Never Smoker  . Smokeless tobacco: Never Used  Substance Use Topics  . Alcohol use: No  . Drug use: No     Allergies   Feldene [piroxicam]   Review of Systems Review of Systems  Constitutional: Positive for fatigue. Negative for appetite change, chills, diaphoresis and fever.  HENT: Positive for dental problem, facial swelling and mouth sores (red and swollen gum). Negative for congestion, ear discharge, ear pain, postnasal drip, rhinorrhea, sinus pressure, sinus pain, sore throat and trouble swallowing.   Eyes: Negative for photophobia and visual disturbance.  Respiratory: Negative for cough, chest tightness, shortness of breath and wheezing.   Cardiovascular: Negative for chest pain and palpitations.  Gastrointestinal: Negative for constipation, diarrhea, nausea and vomiting.  Musculoskeletal: Negative for arthralgias, back pain, myalgias, neck pain and neck stiffness.  Skin: Negative for color change, rash and wound.  Allergic/Immunologic: Positive for environmental allergies. Negative for food allergies and immunocompromised state.  Neurological: Negative for dizziness, seizures, syncope, weakness, light-headedness, numbness and headaches.  Hematological: Negative for adenopathy. Does not bruise/bleed easily.     Physical Exam Triage Vital Signs ED Triage Vitals  Enc Vitals Group     BP 03/28/20 1033 (!) 207/90     Pulse Rate 03/28/20 1033 63     Resp 03/28/20 1033 18     Temp 03/28/20 1033 99 F (37.2 C)     Temp Source 03/28/20 1033 Oral     SpO2 03/28/20 1033 99 %     Weight 03/28/20 1030 175 lb (79.4 kg)     Height 03/28/20 1030 5\' 6"  (1.676 m)     Head Circumference --      Peak Flow --       Pain Score 03/28/20 1030 10     Pain Loc --      Pain Edu? --      Excl. in GC? --    No data found.  Updated Vital Signs BP (!) 188/82 (BP Location: Left Arm)   Pulse 63   Temp 99 F (37.2 C) (Oral)   Resp 18   Ht 5\' 6"  (1.676 m)   Wt 175 lb (79.4 kg)   SpO2 99%   BMI 28.25 kg/m   Visual Acuity Right Eye Distance:   Left Eye Distance:   Bilateral Distance:    Right Eye Near:   Left Eye Near:    Bilateral Near:     Physical Exam Vitals and nursing note reviewed.  Constitutional:      General: She is awake. She is not in acute distress.    Appearance: She  is well-developed. She is ill-appearing.     Comments: She is sitting in the exam chair in no acute distress but appears in pain.   HENT:     Head: Normocephalic and atraumatic. No abrasion or contusion.     Jaw: There is normal jaw occlusion.      Comments: Swelling and tenderness just left of midline of upper lip that extends just below left nostril. No distinct redness or rash. No other facial tenderness.     Right Ear: Hearing and external ear normal.     Left Ear: Hearing and external ear normal.     Nose: Nose normal.     Right Sinus: No maxillary sinus tenderness.     Left Sinus: No maxillary sinus tenderness.     Mouth/Throat:     Lips: Pink.     Mouth: Mucous membranes are moist.     Dentition: Abnormal dentition. Dental tenderness, gingival swelling, dental caries and dental abscesses (upper gum) present.     Pharynx: Oropharynx is clear. Uvula midline. No pharyngeal swelling or posterior oropharyngeal erythema.      Comments: Red, inflamed upper gum above central front teeth. Hard and very tender to touch. Some yellowish discharge draining to the right of the abscess. Also some redness and swelling extending mid-lateral area of upper gum on left side. Most teeth missing. Front 2 teeth have black area on lateral side of tooth and are in poor repair. No swelling or redness of upper palate or pharynx.    Cardiovascular:     Rate and Rhythm: Normal rate and regular rhythm.     Heart sounds: Normal heart sounds. No murmur.  Pulmonary:     Effort: Pulmonary effort is normal. No respiratory distress.     Breath sounds: Normal breath sounds and air entry. No decreased air movement. No decreased breath sounds, wheezing or rhonchi.  Musculoskeletal:     Cervical back: Normal range of motion and neck supple. No tenderness.  Lymphadenopathy:     Cervical: No cervical adenopathy.  Skin:    General: Skin is warm and dry.     Capillary Refill: Capillary refill takes less than 2 seconds.     Findings: No erythema or rash.  Neurological:     General: No focal deficit present.     Mental Status: She is alert and oriented to person, place, and time.  Psychiatric:        Mood and Affect: Mood normal.        Behavior: Behavior normal. Behavior is cooperative.        Thought Content: Thought content normal.        Judgment: Judgment normal.      UC Treatments / Results  Labs (all labs ordered are listed, but only abnormal results are displayed) Labs Reviewed - No data to display  EKG   Radiology No results found.  Procedures Procedures (including critical care time)  Medications Ordered in UC Medications - No data to display  Initial Impression / Assessment and Plan / UC Course  I have reviewed the triage vital signs and the nursing notes.  Pertinent labs & imaging results that were available during my care of the patient were reviewed by me and considered in my medical decision making (see chart for details).    Reviewed with patient that she appears to have a deep upper gum/dental abscess. Recommend start Clindamycin 300mg  3 times a day for 10 days. May take Naproxen 500mg  twice a day as  needed for pain (is allergic to Feldene but able to take Aleve with no side effects). Recommend apply warm compresses to area to help facilitate drainage and may alternate with ice for comfort.  Avoid additional hydrogen peroxide use but may continue with warm salt water gargles. May take Tramadol 50mg  every 8 to 12 hours as needed for severe pain. Discussed with patient that she needs to see a dentist for further evaluation.  Also discussed that severe pain may raise blood pressure but usually not this elevated from pain alone- continue to monitor. Since she is asymptomatic, recommend follow-up with her PCP if her BP remains >140/>90 in the next 2 days. If any chest pain, difficulty breathing, headache, vision changes or dizziness occur, go to the ER ASAP. Otherwise, may call [facility] today - if financially able to see them (their call center is open on Sunday to schedule appointments) or call [facility] tomorrow to schedule appointment for follow-up.  Final Clinical Impressions(s) / UC Diagnoses   Final diagnoses:  Abscess of upper gum  Left facial swelling  Dental cavities  Elevated blood pressure reading in office with diagnosis of hypertension     Discharge Instructions     Recommend start Clindamycin 300mg  3 times a day for 10 days. Start Naproxen 500mg  twice a day with food as needed for pain and swelling. Apply warm compress to area for comfort and to help with drainage. May alternate with ice as needed for comfort. May use Tramadol 50mg  every 8 to 12 hours as needed for severe pain. Call [facility] today to schedule appointment or call [facility] tomorrow for low cost dental options for further evaluation. Also continue to monitor your blood pressure. If any chest pain, difficulty breathing, headache, vision changes or dizziness occur, go to the ER ASAP. Otherwise, follow-up with your PCP if your blood pressure remains >140/>90.      ED Prescriptions    Medication Sig Dispense Auth. Provider   clindamycin (CLEOCIN) 300 MG capsule Take 1 capsule (300 mg total) by mouth 3 (three) times daily for 10 days. 30 capsule , NP   naproxen (NAPROSYN) 500 MG  tablet Take 1 tablet (500 mg total) by mouth 2 (two) times daily as needed for moderate pain. 20 tablet , NP   traMADol (ULTRAM) 50 MG tablet Take 1 tablet (50 mg total) by mouth every 8 (eight) hours as needed for severe pain. 12 tablet Aniyha Tate, , NP     I have reviewed the PDMP during this encounter. Last active Rx was for Tramadol #20 on 12/02/2019. I feel the benefits outweigh the risks for prescribing a controlled medication at this time.    Sudie Grumbling, NP 03/28/20 [phone]

## 2020-03-28 NOTE — Discharge Instructions (Addendum)
Recommend start Clindamycin 300mg  3 times a day for 10 days. Start Naproxen 500mg  twice a day with food as needed for pain and swelling. Apply warm compress to area for comfort and to help with drainage. May alternate with ice as needed for comfort. May use Tramadol 50mg  every 8 to 12 hours as needed for severe pain. Call [facility] today to schedule appointment or call [facility] tomorrow for low cost dental options for further evaluation. Also continue to monitor your blood pressure. If any chest pain, difficulty breathing, headache, vision changes or dizziness occur, go to the ER ASAP. Otherwise, follow-up with your PCP if your blood pressure remains >140/>90.

## 2020-05-13 ENCOUNTER — Ambulatory Visit
Admission: RE | Admit: 2020-05-13 | Discharge: 2020-05-13 | Disposition: A | Discharge status: Home / Self Care | Payer: 59 | Source: Ambulatory Visit | Attending: Internal Medicine | Admitting: Internal Medicine

## 2020-05-13 DIAGNOSIS — Z1231 Encounter for screening mammogram for malignant neoplasm of breast: Secondary | ICD-10-CM | POA: Diagnosis not present

## 2020-07-06 ENCOUNTER — Other Ambulatory Visit: Payer: Self-pay | Admitting: Internal Medicine

## 2020-07-21 ENCOUNTER — Other Ambulatory Visit: Payer: Self-pay

## 2020-07-21 ENCOUNTER — Other Ambulatory Visit
Admission: RE | Admit: 2020-07-21 | Discharge: 2020-07-21 | Disposition: A | Discharge status: Home / Self Care | Payer: 59 | Attending: Internal Medicine | Admitting: Internal Medicine

## 2020-07-21 DIAGNOSIS — I10 Essential (primary) hypertension: Secondary | ICD-10-CM | POA: Diagnosis not present

## 2020-07-21 DIAGNOSIS — E785 Hyperlipidemia, unspecified: Secondary | ICD-10-CM | POA: Diagnosis not present

## 2020-07-21 DIAGNOSIS — R7309 Other abnormal glucose: Secondary | ICD-10-CM | POA: Insufficient documentation

## 2020-07-21 LAB — BASIC METABOLIC PANEL
Anion gap: 11 (ref 5–15)
BUN: 25 mg/dL — ABNORMAL HIGH (ref 8–23)
CO2: 29 mmol/L (ref 22–32)
Calcium: 9.4 mg/dL (ref 8.9–10.3)
Chloride: 101 mmol/L (ref 98–111)
Creatinine, Ser: 0.93 mg/dL (ref 0.44–1.00)
GFR calc Af Amer: >60 mL/min (ref >60)
GFR calc non Af Amer: >60 mL/min (ref >60)
Glucose, Bld: 111 mg/dL — ABNORMAL HIGH (ref 70–99)
Potassium: 3.5 mmol/L (ref 3.5–5.1)
Sodium: 141 mmol/L (ref 135–145)

## 2020-07-21 LAB — LIPID PANEL
Cholesterol: 208 mg/dL — ABNORMAL HIGH (ref 0–200)
HDL: 62 mg/dL (ref >40)
LDL Cholesterol: 119 mg/dL — ABNORMAL HIGH (ref 0–99)
Total CHOL/HDL Ratio: 3.4 RATIO
Triglycerides: 135 mg/dL (ref <150)
VLDL: 27 mg/dL (ref 0–40)

## 2020-07-21 LAB — HEMOGLOBIN A1C
Hgb A1c MFr Bld: 5.9 % — ABNORMAL HIGH (ref 4.8–5.6)
Mean Plasma Glucose: 122.63 mg/dL

## 2020-07-26 DIAGNOSIS — E785 Hyperlipidemia, unspecified: Secondary | ICD-10-CM | POA: Diagnosis not present

## 2020-07-26 DIAGNOSIS — R7309 Other abnormal glucose: Secondary | ICD-10-CM | POA: Diagnosis not present

## 2020-07-26 DIAGNOSIS — I10 Essential (primary) hypertension: Secondary | ICD-10-CM | POA: Diagnosis not present

## 2020-09-08 DIAGNOSIS — J019 Acute sinusitis, unspecified: Secondary | ICD-10-CM | POA: Diagnosis not present

## 2021-01-18 ENCOUNTER — Other Ambulatory Visit
Admission: RE | Admit: 2021-01-18 | Discharge: 2021-01-18 | Disposition: A | Discharge status: Home / Self Care | Payer: 59 | Source: Ambulatory Visit | Attending: Internal Medicine | Admitting: Internal Medicine

## 2021-01-18 DIAGNOSIS — E785 Hyperlipidemia, unspecified: Secondary | ICD-10-CM | POA: Diagnosis not present

## 2021-01-18 DIAGNOSIS — I10 Essential (primary) hypertension: Secondary | ICD-10-CM | POA: Insufficient documentation

## 2021-01-18 DIAGNOSIS — R7309 Other abnormal glucose: Secondary | ICD-10-CM | POA: Diagnosis not present

## 2021-01-18 LAB — COMPREHENSIVE METABOLIC PANEL
ALT: 19 U/L (ref 0–44)
AST: 40 U/L (ref 15–41)
Albumin: 3.9 g/dL (ref 3.5–5.0)
Alkaline Phosphatase: 71 U/L (ref 38–126)
Anion gap: 10 (ref 5–15)
BUN: 23 mg/dL (ref 8–23)
CO2: 30 mmol/L (ref 22–32)
Calcium: 9.4 mg/dL (ref 8.9–10.3)
Chloride: 102 mmol/L (ref 98–111)
Creatinine, Ser: 0.99 mg/dL (ref 0.44–1.00)
GFR, Estimated: 59 mL/min — ABNORMAL LOW (ref >60)
Glucose, Bld: 114 mg/dL — ABNORMAL HIGH (ref 70–99)
Potassium: 4.2 mmol/L (ref 3.5–5.1)
Sodium: 142 mmol/L (ref 135–145)
Total Bilirubin: 0.6 mg/dL (ref 0.3–1.2)
Total Protein: 7.4 g/dL (ref 6.5–8.1)

## 2021-01-18 LAB — HEMOGLOBIN A1C
Hgb A1c MFr Bld: 6 % — ABNORMAL HIGH (ref 4.8–5.6)
Mean Plasma Glucose: 126 mg/dL

## 2021-01-18 LAB — LIPID PANEL
Cholesterol: 238 mg/dL — ABNORMAL HIGH (ref 0–200)
HDL: 71 mg/dL (ref >40)
LDL Cholesterol: 134 mg/dL — ABNORMAL HIGH (ref 0–99)
Total CHOL/HDL Ratio: 3.4 RATIO
Triglycerides: 167 mg/dL — ABNORMAL HIGH (ref <150)
VLDL: 33 mg/dL (ref 0–40)

## 2021-01-18 LAB — TSH: TSH: 0.676 u[IU]/mL (ref 0.350–4.500)

## 2021-01-25 ENCOUNTER — Other Ambulatory Visit: Payer: Self-pay | Admitting: Internal Medicine

## 2021-01-25 DIAGNOSIS — R7309 Other abnormal glucose: Secondary | ICD-10-CM | POA: Diagnosis not present

## 2021-01-25 DIAGNOSIS — I10 Essential (primary) hypertension: Secondary | ICD-10-CM | POA: Diagnosis not present

## 2021-01-25 DIAGNOSIS — E785 Hyperlipidemia, unspecified: Secondary | ICD-10-CM | POA: Diagnosis not present

## 2021-02-21 ENCOUNTER — Other Ambulatory Visit: Payer: Self-pay | Admitting: Internal Medicine

## 2021-03-01 ENCOUNTER — Other Ambulatory Visit: Payer: Self-pay | Admitting: Internal Medicine

## 2021-03-25 ENCOUNTER — Other Ambulatory Visit: Payer: Self-pay | Admitting: Internal Medicine

## 2021-03-25 DIAGNOSIS — Z1231 Encounter for screening mammogram for malignant neoplasm of breast: Secondary | ICD-10-CM

## 2021-04-26 ENCOUNTER — Other Ambulatory Visit: Payer: Self-pay

## 2021-04-26 MED FILL — Rosuvastatin Calcium Tab 20 MG: ORAL | 90 days supply | Qty: 90 | Fill #0 | Status: AC

## 2021-05-13 ENCOUNTER — Other Ambulatory Visit: Payer: Self-pay

## 2021-05-13 MED FILL — Valsartan-Hydrochlorothiazide Tab 160-25 MG: ORAL | 90 days supply | Qty: 90 | Fill #0 | Status: AC

## 2021-05-13 MED FILL — Omeprazole Cap Delayed Release 40 MG: ORAL | 90 days supply | Qty: 90 | Fill #0 | Status: AC

## 2021-05-17 ENCOUNTER — Other Ambulatory Visit
Admission: RE | Admit: 2021-05-17 | Discharge: 2021-05-17 | Disposition: A | Discharge status: Home / Self Care | Payer: 59 | Attending: Internal Medicine | Admitting: Internal Medicine

## 2021-05-17 DIAGNOSIS — E785 Hyperlipidemia, unspecified: Secondary | ICD-10-CM | POA: Insufficient documentation

## 2021-05-17 DIAGNOSIS — I10 Essential (primary) hypertension: Secondary | ICD-10-CM | POA: Diagnosis not present

## 2021-05-17 DIAGNOSIS — R7309 Other abnormal glucose: Secondary | ICD-10-CM | POA: Diagnosis not present

## 2021-05-17 LAB — COMPREHENSIVE METABOLIC PANEL
ALT: 20 U/L (ref 0–44)
AST: 41 U/L (ref 15–41)
Albumin: 4.1 g/dL (ref 3.5–5.0)
Alkaline Phosphatase: 67 U/L (ref 38–126)
Anion gap: 7 (ref 5–15)
BUN: 25 mg/dL — ABNORMAL HIGH (ref 8–23)
CO2: 27 mmol/L (ref 22–32)
Calcium: 9.4 mg/dL (ref 8.9–10.3)
Chloride: 107 mmol/L (ref 98–111)
Creatinine, Ser: 0.89 mg/dL (ref 0.44–1.00)
GFR, Estimated: >60 mL/min (ref >60)
Glucose, Bld: 104 mg/dL — ABNORMAL HIGH (ref 70–99)
Potassium: 4 mmol/L (ref 3.5–5.1)
Sodium: 141 mmol/L (ref 135–145)
Total Bilirubin: 0.8 mg/dL (ref 0.3–1.2)
Total Protein: 7.1 g/dL (ref 6.5–8.1)

## 2021-05-17 LAB — LIPID PANEL
Cholesterol: 164 mg/dL (ref 0–200)
HDL: 67 mg/dL (ref >40)
LDL Cholesterol: 79 mg/dL (ref 0–99)
Total CHOL/HDL Ratio: 2.4 RATIO
Triglycerides: 89 mg/dL (ref <150)
VLDL: 18 mg/dL (ref 0–40)

## 2021-05-18 LAB — HEMOGLOBIN A1C
Hgb A1c MFr Bld: 6.2 % — ABNORMAL HIGH (ref 4.8–5.6)
Mean Plasma Glucose: 131 mg/dL

## 2021-05-25 DIAGNOSIS — Z0000 Encounter for general adult medical examination without abnormal findings: Secondary | ICD-10-CM | POA: Diagnosis not present

## 2021-05-25 DIAGNOSIS — Z23 Encounter for immunization: Secondary | ICD-10-CM | POA: Diagnosis not present

## 2021-05-30 MED FILL — Metoprolol Tartrate Tab 50 MG: ORAL | 90 days supply | Qty: 180 | Fill #0 | Status: AC

## 2021-05-31 ENCOUNTER — Other Ambulatory Visit: Payer: Self-pay

## 2021-06-01 ENCOUNTER — Ambulatory Visit
Admission: RE | Admit: 2021-06-01 | Discharge: 2021-06-01 | Disposition: A | Discharge status: Home / Self Care | Payer: 59 | Source: Ambulatory Visit | Attending: Internal Medicine | Admitting: Internal Medicine

## 2021-06-01 ENCOUNTER — Other Ambulatory Visit: Payer: Self-pay

## 2021-06-01 DIAGNOSIS — Z1231 Encounter for screening mammogram for malignant neoplasm of breast: Secondary | ICD-10-CM | POA: Diagnosis not present

## 2021-08-04 ENCOUNTER — Other Ambulatory Visit: Payer: Self-pay

## 2021-08-04 MED FILL — Rosuvastatin Calcium Tab 20 MG: ORAL | 90 days supply | Qty: 90 | Fill #1 | Status: AC

## 2021-08-04 MED FILL — Valsartan-Hydrochlorothiazide Tab 160-25 MG: ORAL | 90 days supply | Qty: 90 | Fill #1 | Status: AC

## 2021-08-10 ENCOUNTER — Other Ambulatory Visit: Payer: Self-pay

## 2021-08-10 MED FILL — Omeprazole Cap Delayed Release 40 MG: ORAL | 90 days supply | Qty: 90 | Fill #1 | Status: AC

## 2021-08-29 ENCOUNTER — Other Ambulatory Visit: Payer: Self-pay

## 2021-08-29 MED FILL — Metoprolol Tartrate Tab 50 MG: ORAL | 90 days supply | Qty: 180 | Fill #1 | Status: CN

## 2021-08-29 MED FILL — Metoprolol Tartrate Tab 50 MG: ORAL | 90 days supply | Qty: 180 | Fill #1 | Status: AC

## 2021-10-05 ENCOUNTER — Other Ambulatory Visit
Admission: RE | Admit: 2021-10-05 | Discharge: 2021-10-05 | Disposition: A | Discharge status: Home / Self Care | Payer: 59 | Attending: Internal Medicine | Admitting: Internal Medicine

## 2021-10-05 DIAGNOSIS — E785 Hyperlipidemia, unspecified: Secondary | ICD-10-CM | POA: Insufficient documentation

## 2021-10-05 DIAGNOSIS — I10 Essential (primary) hypertension: Secondary | ICD-10-CM | POA: Diagnosis not present

## 2021-10-05 DIAGNOSIS — R739 Hyperglycemia, unspecified: Secondary | ICD-10-CM | POA: Diagnosis not present

## 2021-10-05 LAB — COMPREHENSIVE METABOLIC PANEL
ALT: 19 U/L (ref 0–44)
AST: 38 U/L (ref 15–41)
Albumin: 3.8 g/dL (ref 3.5–5.0)
Alkaline Phosphatase: 73 U/L (ref 38–126)
Anion gap: 8 (ref 5–15)
BUN: 29 mg/dL — ABNORMAL HIGH (ref 8–23)
CO2: 31 mmol/L (ref 22–32)
Calcium: 9.3 mg/dL (ref 8.9–10.3)
Chloride: 104 mmol/L (ref 98–111)
Creatinine, Ser: 0.86 mg/dL (ref 0.44–1.00)
GFR, Estimated: >60 mL/min (ref >60)
Glucose, Bld: 109 mg/dL — ABNORMAL HIGH (ref 70–99)
Potassium: 4.2 mmol/L (ref 3.5–5.1)
Sodium: 143 mmol/L (ref 135–145)
Total Bilirubin: 0.7 mg/dL (ref 0.3–1.2)
Total Protein: 7.4 g/dL (ref 6.5–8.1)

## 2021-10-05 LAB — HEMOGLOBIN A1C
Hgb A1c MFr Bld: 6.2 % — ABNORMAL HIGH (ref 4.8–5.6)
Mean Plasma Glucose: 131.24 mg/dL

## 2021-10-05 LAB — LIPID PANEL
Cholesterol: 174 mg/dL (ref 0–200)
HDL: 62 mg/dL (ref >40)
LDL Cholesterol: 75 mg/dL (ref 0–99)
Total CHOL/HDL Ratio: 2.8 RATIO
Triglycerides: 186 mg/dL — ABNORMAL HIGH (ref <150)
VLDL: 37 mg/dL (ref 0–40)

## 2021-10-05 LAB — TSH: TSH: 0.689 u[IU]/mL (ref 0.350–4.500)

## 2021-10-12 DIAGNOSIS — I10 Essential (primary) hypertension: Secondary | ICD-10-CM | POA: Diagnosis not present

## 2021-10-12 DIAGNOSIS — R7309 Other abnormal glucose: Secondary | ICD-10-CM | POA: Diagnosis not present

## 2021-10-12 DIAGNOSIS — E785 Hyperlipidemia, unspecified: Secondary | ICD-10-CM | POA: Diagnosis not present

## 2021-10-13 ENCOUNTER — Other Ambulatory Visit: Payer: Self-pay

## 2021-10-13 MED ORDER — MELOXICAM 15 MG PO TABS
ORAL_TABLET | ORAL | 1 refills | Status: AC
  Filled 2021-10-13: qty 20, 20d supply, fill #0
  Filled 2021-12-14: qty 20, 20d supply, fill #1

## 2021-11-02 MED FILL — Rosuvastatin Calcium Tab 20 MG: ORAL | 90 days supply | Qty: 90 | Fill #2 | Status: AC

## 2021-11-02 MED FILL — Omeprazole Cap Delayed Release 40 MG: ORAL | 90 days supply | Qty: 90 | Fill #2 | Status: AC

## 2021-11-02 MED FILL — Valsartan-Hydrochlorothiazide Tab 160-25 MG: ORAL | 90 days supply | Qty: 90 | Fill #2 | Status: AC

## 2021-11-03 ENCOUNTER — Other Ambulatory Visit: Payer: Self-pay

## 2021-12-05 ENCOUNTER — Other Ambulatory Visit: Payer: Self-pay

## 2021-12-05 MED FILL — Metoprolol Tartrate Tab 50 MG: ORAL | 90 days supply | Qty: 180 | Fill #2 | Status: AC

## 2021-12-14 ENCOUNTER — Other Ambulatory Visit: Payer: Self-pay

## 2021-12-15 ENCOUNTER — Other Ambulatory Visit: Payer: Self-pay

## 2022-01-28 ENCOUNTER — Other Ambulatory Visit: Payer: Self-pay

## 2022-01-30 ENCOUNTER — Other Ambulatory Visit: Payer: Self-pay

## 2022-01-30 MED ORDER — OMEPRAZOLE 40 MG PO CPDR
DELAYED_RELEASE_CAPSULE | Freq: Every day | ORAL | 3 refills | Status: AC
  Filled 2022-01-30: qty 90, 90d supply, fill #0
  Filled 2022-05-11: qty 90, 90d supply, fill #1
  Filled 2022-08-15: qty 90, 90d supply, fill #2
  Filled 2022-11-15: qty 90, 90d supply, fill #3

## 2022-02-03 ENCOUNTER — Other Ambulatory Visit: Payer: Self-pay

## 2022-02-03 MED ORDER — VALSARTAN-HYDROCHLOROTHIAZIDE 160-25 MG PO TABS
1.0000 | ORAL_TABLET | Freq: Every day | ORAL | 3 refills | Status: DC
  Filled 2022-02-03: qty 90, 90d supply, fill #0
  Filled 2022-05-11: qty 90, 90d supply, fill #1
  Filled 2022-08-06: qty 90, 90d supply, fill #2
  Filled 2022-11-03: qty 90, 90d supply, fill #3

## 2022-02-03 MED ORDER — ROSUVASTATIN CALCIUM 20 MG PO TABS
ORAL_TABLET | Freq: Every evening | ORAL | 3 refills | Status: DC
  Filled 2022-02-03: qty 90, 90d supply, fill #0
  Filled 2022-05-11: qty 90, 90d supply, fill #1
  Filled 2022-08-15: qty 90, 90d supply, fill #2
  Filled 2022-11-15: qty 90, 90d supply, fill #3

## 2022-03-02 ENCOUNTER — Other Ambulatory Visit: Payer: Self-pay

## 2022-03-02 MED ORDER — METOPROLOL TARTRATE 50 MG PO TABS
ORAL_TABLET | Freq: Two times a day (BID) | ORAL | 3 refills | Status: DC
  Filled 2022-03-02: qty 180, 90d supply, fill #0
  Filled 2022-06-07: qty 180, 90d supply, fill #1
  Filled 2022-09-14: qty 180, 90d supply, fill #2
  Filled 2022-12-13: qty 180, 90d supply, fill #3

## 2022-03-20 ENCOUNTER — Other Ambulatory Visit
Admission: RE | Admit: 2022-03-20 | Discharge: 2022-03-20 | Disposition: A | Discharge status: Home / Self Care | Payer: 59 | Source: Ambulatory Visit | Attending: Internal Medicine | Admitting: Internal Medicine

## 2022-03-20 DIAGNOSIS — E785 Hyperlipidemia, unspecified: Secondary | ICD-10-CM | POA: Insufficient documentation

## 2022-03-20 DIAGNOSIS — R7303 Prediabetes: Secondary | ICD-10-CM | POA: Insufficient documentation

## 2022-03-20 DIAGNOSIS — I10 Essential (primary) hypertension: Secondary | ICD-10-CM | POA: Insufficient documentation

## 2022-03-20 LAB — COMPREHENSIVE METABOLIC PANEL
ALT: 16 U/L (ref 0–44)
AST: 38 U/L (ref 15–41)
Albumin: 3.8 g/dL (ref 3.5–5.0)
Alkaline Phosphatase: 68 U/L (ref 38–126)
Anion gap: 8 (ref 5–15)
BUN: 23 mg/dL (ref 8–23)
CO2: 28 mmol/L (ref 22–32)
Calcium: 9.2 mg/dL (ref 8.9–10.3)
Chloride: 109 mmol/L (ref 98–111)
Creatinine, Ser: 1.05 mg/dL — ABNORMAL HIGH (ref 0.44–1.00)
GFR, Estimated: 55 mL/min — ABNORMAL LOW (ref >60)
Glucose, Bld: 112 mg/dL — ABNORMAL HIGH (ref 70–99)
Potassium: 3.5 mmol/L (ref 3.5–5.1)
Sodium: 145 mmol/L (ref 135–145)
Total Bilirubin: 0.8 mg/dL (ref 0.3–1.2)
Total Protein: 7.2 g/dL (ref 6.5–8.1)

## 2022-03-20 LAB — LIPID PANEL
Cholesterol: 167 mg/dL (ref 0–200)
HDL: 69 mg/dL (ref >40)
LDL Cholesterol: 81 mg/dL (ref 0–99)
Total CHOL/HDL Ratio: 2.4 RATIO
Triglycerides: 83 mg/dL (ref <150)
VLDL: 17 mg/dL (ref 0–40)

## 2022-03-20 LAB — HEMOGLOBIN A1C
Hgb A1c MFr Bld: 6.2 % — ABNORMAL HIGH (ref 4.8–5.6)
Mean Plasma Glucose: 131.24 mg/dL

## 2022-03-29 DIAGNOSIS — I10 Essential (primary) hypertension: Secondary | ICD-10-CM | POA: Diagnosis not present

## 2022-03-29 DIAGNOSIS — R7309 Other abnormal glucose: Secondary | ICD-10-CM | POA: Diagnosis not present

## 2022-03-29 DIAGNOSIS — E785 Hyperlipidemia, unspecified: Secondary | ICD-10-CM | POA: Diagnosis not present

## 2022-03-30 ENCOUNTER — Other Ambulatory Visit: Payer: Self-pay

## 2022-03-30 MED ORDER — ALBUTEROL SULFATE HFA 108 (90 BASE) MCG/ACT IN AERS
INHALATION_SPRAY | RESPIRATORY_TRACT | 3 refills | Status: AC
  Filled 2022-03-30: qty 6.7, 25d supply, fill #0
  Filled 2022-11-29: qty 6.7, 25d supply, fill #1

## 2022-04-25 ENCOUNTER — Other Ambulatory Visit: Payer: Self-pay | Admitting: Internal Medicine

## 2022-04-25 DIAGNOSIS — Z1231 Encounter for screening mammogram for malignant neoplasm of breast: Secondary | ICD-10-CM

## 2022-05-11 ENCOUNTER — Other Ambulatory Visit: Payer: Self-pay

## 2022-06-07 ENCOUNTER — Other Ambulatory Visit: Payer: Self-pay

## 2022-06-14 ENCOUNTER — Ambulatory Visit
Admission: RE | Admit: 2022-06-14 | Discharge: 2022-06-14 | Disposition: A | Discharge status: Home / Self Care | Payer: 59 | Source: Ambulatory Visit | Attending: Internal Medicine | Admitting: Internal Medicine

## 2022-06-14 DIAGNOSIS — Z1231 Encounter for screening mammogram for malignant neoplasm of breast: Secondary | ICD-10-CM | POA: Insufficient documentation

## 2022-08-07 ENCOUNTER — Other Ambulatory Visit: Payer: Self-pay

## 2022-08-08 ENCOUNTER — Other Ambulatory Visit: Payer: Self-pay

## 2022-08-12 ENCOUNTER — Other Ambulatory Visit
Admission: RE | Admit: 2022-08-12 | Discharge: 2022-08-12 | Disposition: A | Discharge status: Home / Self Care | Payer: 59 | Attending: Internal Medicine | Admitting: Internal Medicine

## 2022-08-12 DIAGNOSIS — E785 Hyperlipidemia, unspecified: Secondary | ICD-10-CM | POA: Diagnosis not present

## 2022-08-12 DIAGNOSIS — I10 Essential (primary) hypertension: Secondary | ICD-10-CM | POA: Diagnosis not present

## 2022-08-12 DIAGNOSIS — R7309 Other abnormal glucose: Secondary | ICD-10-CM | POA: Diagnosis not present

## 2022-08-12 LAB — LIPID PANEL
Cholesterol: 168 mg/dL (ref 0–200)
HDL: 64 mg/dL (ref >40)
LDL Cholesterol: 82 mg/dL (ref 0–99)
Total CHOL/HDL Ratio: 2.6 RATIO
Triglycerides: 110 mg/dL (ref <150)
VLDL: 22 mg/dL (ref 0–40)

## 2022-08-12 LAB — HEMOGLOBIN A1C
Hgb A1c MFr Bld: 6 % — ABNORMAL HIGH (ref 4.8–5.6)
Mean Plasma Glucose: 125.5 mg/dL

## 2022-08-12 LAB — HEMOGLOBIN: Hemoglobin: 12.2 g/dL (ref 12.0–15.0)

## 2022-08-12 LAB — TSH: TSH: 0.648 u[IU]/mL (ref 0.350–4.500)

## 2022-08-16 ENCOUNTER — Other Ambulatory Visit: Payer: Self-pay

## 2022-08-23 DIAGNOSIS — R7309 Other abnormal glucose: Secondary | ICD-10-CM | POA: Diagnosis not present

## 2022-08-23 DIAGNOSIS — E785 Hyperlipidemia, unspecified: Secondary | ICD-10-CM | POA: Diagnosis not present

## 2022-08-23 DIAGNOSIS — I10 Essential (primary) hypertension: Secondary | ICD-10-CM | POA: Diagnosis not present

## 2022-08-25 ENCOUNTER — Other Ambulatory Visit: Payer: Self-pay

## 2022-08-25 MED ORDER — MELOXICAM 15 MG PO TABS
15.0000 mg | ORAL_TABLET | Freq: Every day | ORAL | 3 refills | Status: AC | PRN
  Filled 2022-08-25: qty 30, 30d supply, fill #0
  Filled 2022-10-12: qty 30, 30d supply, fill #1
  Filled 2022-11-17: qty 30, 30d supply, fill #2
  Filled 2022-12-13: qty 30, 30d supply, fill #3

## 2022-09-02 DIAGNOSIS — Z1212 Encounter for screening for malignant neoplasm of rectum: Secondary | ICD-10-CM | POA: Diagnosis not present

## 2022-09-02 DIAGNOSIS — Z1211 Encounter for screening for malignant neoplasm of colon: Secondary | ICD-10-CM | POA: Diagnosis not present

## 2022-09-15 ENCOUNTER — Other Ambulatory Visit: Payer: Self-pay

## 2022-09-19 ENCOUNTER — Other Ambulatory Visit: Payer: Self-pay

## 2022-09-19 MED ORDER — COMIRNATY 30 MCG/0.3ML IM SUSP
INTRAMUSCULAR | 0 refills | Status: DC
  Filled 2022-09-20: qty 0.3, 1d supply, fill #0

## 2022-09-20 ENCOUNTER — Other Ambulatory Visit: Payer: Self-pay

## 2022-09-22 ENCOUNTER — Other Ambulatory Visit: Payer: Self-pay

## 2022-09-22 MED ORDER — COVID-19 MRNA 2023-2024 VACCINE (COMIRNATY) 0.3 ML INJECTION
INTRAMUSCULAR | 0 refills | Status: AC
  Filled 2022-09-22: qty 0.3, 1d supply, fill #0

## 2022-09-27 ENCOUNTER — Other Ambulatory Visit (HOSPITAL_BASED_OUTPATIENT_CLINIC_OR_DEPARTMENT_OTHER): Payer: Self-pay

## 2022-09-27 ENCOUNTER — Other Ambulatory Visit: Payer: Self-pay

## 2022-10-12 ENCOUNTER — Other Ambulatory Visit: Payer: Self-pay

## 2022-10-13 ENCOUNTER — Other Ambulatory Visit (HOSPITAL_BASED_OUTPATIENT_CLINIC_OR_DEPARTMENT_OTHER): Payer: Self-pay

## 2022-10-13 DIAGNOSIS — M545 Low back pain, unspecified: Secondary | ICD-10-CM | POA: Diagnosis not present

## 2022-11-06 ENCOUNTER — Other Ambulatory Visit: Payer: Self-pay

## 2022-11-16 ENCOUNTER — Other Ambulatory Visit: Payer: Self-pay

## 2022-11-17 ENCOUNTER — Ambulatory Visit: Payer: Self-pay | Admitting: Internal Medicine

## 2022-11-17 ENCOUNTER — Other Ambulatory Visit: Payer: Self-pay

## 2022-11-30 ENCOUNTER — Ambulatory Visit: Payer: Self-pay | Admitting: Physician Assistant

## 2023-01-02 ENCOUNTER — Other Ambulatory Visit: Payer: Self-pay

## 2023-01-02 DIAGNOSIS — K219 Gastro-esophageal reflux disease without esophagitis: Secondary | ICD-10-CM | POA: Diagnosis not present

## 2023-01-02 DIAGNOSIS — R195 Other fecal abnormalities: Secondary | ICD-10-CM | POA: Diagnosis not present

## 2023-01-02 MED ORDER — NA SULFATE-K SULFATE-MG SULF 17.5-3.13-1.6 GM/177ML PO SOLN
ORAL | 0 refills | Status: AC
  Filled 2023-01-02: qty 354, 1d supply, fill #0

## 2023-01-08 DIAGNOSIS — Z7189 Other specified counseling: Secondary | ICD-10-CM | POA: Diagnosis not present

## 2023-01-08 DIAGNOSIS — Z131 Encounter for screening for diabetes mellitus: Secondary | ICD-10-CM | POA: Diagnosis not present

## 2023-01-08 DIAGNOSIS — Z1159 Encounter for screening for other viral diseases: Secondary | ICD-10-CM | POA: Diagnosis not present

## 2023-01-08 DIAGNOSIS — Z13220 Encounter for screening for lipoid disorders: Secondary | ICD-10-CM | POA: Diagnosis not present

## 2023-01-08 DIAGNOSIS — Z1389 Encounter for screening for other disorder: Secondary | ICD-10-CM | POA: Diagnosis not present

## 2023-01-17 ENCOUNTER — Other Ambulatory Visit: Payer: Self-pay | Admitting: Nurse Practitioner

## 2023-01-17 DIAGNOSIS — Z1231 Encounter for screening mammogram for malignant neoplasm of breast: Secondary | ICD-10-CM

## 2023-01-29 ENCOUNTER — Other Ambulatory Visit: Payer: Self-pay

## 2023-01-29 MED ORDER — METOPROLOL TARTRATE 50 MG PO TABS
50.0000 mg | ORAL_TABLET | Freq: Two times a day (BID) | ORAL | 3 refills | Status: DC
  Filled 2023-01-29 – 2023-03-20 (×2): qty 180, 90d supply, fill #0
  Filled 2023-06-18: qty 180, 90d supply, fill #1
  Filled 2023-09-03: qty 180, 90d supply, fill #2
  Filled 2023-12-21: qty 180, 90d supply, fill #3

## 2023-01-30 ENCOUNTER — Other Ambulatory Visit: Payer: Self-pay

## 2023-01-30 MED ORDER — ROSUVASTATIN CALCIUM 20 MG PO TABS
20.0000 mg | ORAL_TABLET | Freq: Every day | ORAL | 4 refills | Status: DC
  Filled 2023-01-30: qty 90, 90d supply, fill #0
  Filled 2023-06-04: qty 90, 90d supply, fill #1
  Filled 2023-09-03: qty 90, 90d supply, fill #2
  Filled 2023-12-03: qty 90, 90d supply, fill #3

## 2023-01-30 MED ORDER — VALSARTAN-HYDROCHLOROTHIAZIDE 160-25 MG PO TABS
1.0000 | ORAL_TABLET | Freq: Every day | ORAL | 3 refills | Status: DC
  Filled 2023-01-30: qty 90, 90d supply, fill #0
  Filled 2023-04-30: qty 90, 90d supply, fill #1
  Filled 2023-08-06: qty 90, 90d supply, fill #2
  Filled 2023-11-08: qty 90, 90d supply, fill #3

## 2023-02-01 ENCOUNTER — Other Ambulatory Visit: Payer: Self-pay

## 2023-02-02 ENCOUNTER — Other Ambulatory Visit: Payer: Self-pay

## 2023-02-02 MED ORDER — OMEPRAZOLE 40 MG PO CPDR
40.0000 mg | DELAYED_RELEASE_CAPSULE | Freq: Every morning | ORAL | 4 refills | Status: DC
  Filled 2023-02-02: qty 90, 90d supply, fill #0
  Filled 2023-04-30: qty 90, 90d supply, fill #1
  Filled 2023-07-19: qty 90, 90d supply, fill #2
  Filled 2023-10-10: qty 90, 90d supply, fill #3
  Filled 2023-12-24 (×2): qty 90, 90d supply, fill #4
  Filled 2023-12-24: qty 30, 30d supply, fill #4

## 2023-02-06 ENCOUNTER — Other Ambulatory Visit: Payer: Self-pay

## 2023-02-12 ENCOUNTER — Other Ambulatory Visit: Payer: Self-pay

## 2023-02-12 DIAGNOSIS — I10 Essential (primary) hypertension: Secondary | ICD-10-CM | POA: Diagnosis not present

## 2023-02-12 MED ORDER — AMLODIPINE BESYLATE 5 MG PO TABS
5.0000 mg | ORAL_TABLET | Freq: Every day | ORAL | 10 refills | Status: AC
  Filled 2023-02-12: qty 30, 30d supply, fill #0

## 2023-03-05 ENCOUNTER — Other Ambulatory Visit: Payer: Self-pay

## 2023-03-05 MED ORDER — AMLODIPINE BESYLATE 10 MG PO TABS
10.0000 mg | ORAL_TABLET | Freq: Every day | ORAL | 2 refills | Status: AC
  Filled 2023-03-05: qty 90, 90d supply, fill #0
  Filled 2023-06-04: qty 90, 90d supply, fill #1
  Filled 2023-09-03: qty 90, 90d supply, fill #2

## 2023-03-20 ENCOUNTER — Other Ambulatory Visit: Payer: Self-pay

## 2023-03-23 DIAGNOSIS — Z0130 Encounter for examination of blood pressure without abnormal findings: Secondary | ICD-10-CM | POA: Diagnosis not present

## 2023-03-23 DIAGNOSIS — I10 Essential (primary) hypertension: Secondary | ICD-10-CM | POA: Diagnosis not present

## 2023-03-23 DIAGNOSIS — Z1389 Encounter for screening for other disorder: Secondary | ICD-10-CM | POA: Diagnosis not present

## 2023-03-23 DIAGNOSIS — M5459 Other low back pain: Secondary | ICD-10-CM | POA: Diagnosis not present

## 2023-03-23 DIAGNOSIS — Z0131 Encounter for examination of blood pressure with abnormal findings: Secondary | ICD-10-CM | POA: Diagnosis not present

## 2023-04-10 ENCOUNTER — Encounter: Payer: Self-pay | Admitting: Internal Medicine

## 2023-04-11 ENCOUNTER — Ambulatory Visit: Payer: Commercial Managed Care - PPO | Admitting: Anesthesiology

## 2023-04-11 ENCOUNTER — Other Ambulatory Visit: Payer: Self-pay

## 2023-04-11 ENCOUNTER — Encounter: Payer: Self-pay | Admitting: Internal Medicine

## 2023-04-11 ENCOUNTER — Ambulatory Visit
Admission: RE | Admit: 2023-04-11 | Discharge: 2023-04-11 | Disposition: A | Discharge status: Home / Self Care | Payer: Commercial Managed Care - PPO | Attending: Internal Medicine | Admitting: Internal Medicine

## 2023-04-11 ENCOUNTER — Encounter
Admission: RE | Disposition: A | Discharge status: Home / Self Care | Payer: Self-pay | Source: Home / Self Care | Attending: Internal Medicine

## 2023-04-11 DIAGNOSIS — K5790 Diverticulosis of intestine, part unspecified, without perforation or abscess without bleeding: Secondary | ICD-10-CM | POA: Diagnosis not present

## 2023-04-11 DIAGNOSIS — I10 Essential (primary) hypertension: Secondary | ICD-10-CM | POA: Insufficient documentation

## 2023-04-11 DIAGNOSIS — K5730 Diverticulosis of large intestine without perforation or abscess without bleeding: Secondary | ICD-10-CM | POA: Diagnosis not present

## 2023-04-11 DIAGNOSIS — K640 First degree hemorrhoids: Secondary | ICD-10-CM | POA: Insufficient documentation

## 2023-04-11 DIAGNOSIS — Z1211 Encounter for screening for malignant neoplasm of colon: Secondary | ICD-10-CM | POA: Diagnosis not present

## 2023-04-11 DIAGNOSIS — E785 Hyperlipidemia, unspecified: Secondary | ICD-10-CM | POA: Diagnosis not present

## 2023-04-11 DIAGNOSIS — K648 Other hemorrhoids: Secondary | ICD-10-CM | POA: Diagnosis not present

## 2023-04-11 DIAGNOSIS — M1990 Unspecified osteoarthritis, unspecified site: Secondary | ICD-10-CM | POA: Insufficient documentation

## 2023-04-11 DIAGNOSIS — Z79899 Other long term (current) drug therapy: Secondary | ICD-10-CM | POA: Diagnosis not present

## 2023-04-11 DIAGNOSIS — R195 Other fecal abnormalities: Secondary | ICD-10-CM | POA: Insufficient documentation

## 2023-04-11 DIAGNOSIS — J45909 Unspecified asthma, uncomplicated: Secondary | ICD-10-CM | POA: Insufficient documentation

## 2023-04-11 HISTORY — PX: COLONOSCOPY: SHX5424

## 2023-04-11 SURGERY — COLONOSCOPY
Anesthesia: General

## 2023-04-11 MED ORDER — PROPOFOL 10 MG/ML IV BOLUS
INTRAVENOUS | Status: DC | PRN
  Administered 2023-04-11: 140 ug/kg/min via INTRAVENOUS
  Administered 2023-04-11: 100 mg via INTRAVENOUS

## 2023-04-11 MED ORDER — PROPOFOL 10 MG/ML IV BOLUS
INTRAVENOUS | Status: AC
  Filled 2023-04-11: qty 40

## 2023-04-11 MED ORDER — SODIUM CHLORIDE 0.9 % IV SOLN: INTRAVENOUS | Status: DC

## 2023-04-11 MED ORDER — LIDOCAINE HCL (PF) 2 % IJ SOLN
INTRAMUSCULAR | Status: AC
  Filled 2023-04-11: qty 5

## 2023-04-11 MED ORDER — PHENYLEPHRINE 80 MCG/ML (10ML) SYRINGE FOR IV PUSH (FOR BLOOD PRESSURE SUPPORT)
PREFILLED_SYRINGE | INTRAVENOUS | Status: AC
  Filled 2023-04-11: qty 10

## 2023-04-11 MED ORDER — LIDOCAINE HCL (CARDIAC) PF 100 MG/5ML IV SOSY
PREFILLED_SYRINGE | INTRAVENOUS | Status: DC | PRN
  Administered 2023-04-11: 100 mg via INTRAVENOUS

## 2023-04-11 NOTE — Interval H&P Note (Signed)
History and Physical Interval Note:  04/11/2023 12:34 PM  Alyssa Baird  has presented today for surgery, with the diagnosis of 787.7 (ICD-9-CM) - R19.5 (ICD-10-CM) - Positive colorectal cancer screening using Cologuard test.  The various methods of treatment have been discussed with the patient and family. After consideration of risks, benefits and other options for treatment, the patient has consented to  Procedure(s): COLONOSCOPY (N/A) as a surgical intervention.  The patient's history has been reviewed, patient examined, no change in status, stable for surgery.  I have reviewed the patient's chart and labs.  Questions were answered to the patient's satisfaction.     [place], [place]

## 2023-04-11 NOTE — H&P (Signed)
Outpatient short stay form Pre-procedure 04/11/2023 12:33 PM Alyssa Laduca K. Alyssa Baird, M.D.  Primary Physician: Edmonia Lynch, NP  Reason for visit:  Positive cologuard dna stool test  History of present illness:  Patient is a pleasant 77 y/o female/female presenting on referral from primary care provider for a POSITIVE Cologuard result. Patient denies change in bowel habits, rectal bleeding, weight loss or abdominal pain.       Current Facility-Administered Medications:    0.9 %  sodium chloride infusion, , Intravenous, Continuous, [place], Boykin Nearing, MD, Last Rate: 20 mL/hr at 04/11/23 1201, New Bag at 04/11/23 1201  Medications Prior to Admission  Medication Sig Dispense Refill Last Dose   acetaminophen (TYLENOL) 500 MG tablet Take 1,000 mg by mouth every 6 (six) hours as needed (for pain.).   04/10/2023   albuterol (PROVENTIL HFA;VENTOLIN HFA) 108 (90 Base) MCG/ACT inhaler Inhale 2 puffs into the lungs every 6 (six) hours as needed for wheezing or shortness of breath. 1 Inhaler 0 04/10/2023   albuterol (VENTOLIN HFA) 108 (90 Base) MCG/ACT inhaler Inhale into the lungs. 6.7 g 3 04/10/2023   amLODipine (NORVASC) 10 MG tablet Take 1 tablet (10 mg total) by mouth daily for high blood pressure. 90 tablet 2 04/10/2023   amLODipine (NORVASC) 5 MG tablet Take 1 tablet (5 mg total) by mouth daily. 30 tablet 10 04/10/2023   aspirin EC 81 MG tablet Take 81 mg by mouth daily.   04/10/2023   Cholecalciferol (D3 DOTS) 2000 units TBDP Take 2,000 Units by mouth daily.   04/10/2023   COVID-19 mRNA vaccine 2023-2024 (COMIRNATY) SUSP injection Inject into the muscle. 0.3 mL 0 04/10/2023   latanoprost (XALATAN) 0.005 % ophthalmic solution Place 1 drop into both eyes at bedtime.   04/10/2023   loratadine (CLARITIN) 10 MG tablet Take 10 mg by mouth daily as needed for allergies.   04/10/2023   lovastatin (MEVACOR) 40 MG tablet Take 40 mg by mouth every evening.  2 04/10/2023   meloxicam (MOBIC) 15 MG tablet Take one tablet by mouth daily as  needed 20 tablet 1 04/10/2023   meloxicam (MOBIC) 15 MG tablet Take 1 tablet (15 mg total) by mouth daily as needed. 30 tablet 3 04/10/2023   metoprolol tartrate (LOPRESSOR) 50 MG tablet Take 50 mg by mouth 2 (two) times daily.  5 04/10/2023   metoprolol tartrate (LOPRESSOR) 50 MG tablet Take 1 tablet (50 mg total) by mouth 2 (two) times daily. 180 tablet 3 04/11/2023 at 0600   Na Sulfate-K Sulfate-Mg Sulf 17.5-3.13-1.6 GM/177ML SOLN Take both bottles at the times instructed by your provider. 354 mL 0 04/10/2023   naproxen (NAPROSYN) 500 MG tablet Take 1 tablet (500 mg total) by mouth 2 (two) times daily as needed for moderate pain. 20 tablet 0 04/10/2023   omeprazole (PRILOSEC) 40 MG capsule Take 40 mg by mouth daily before breakfast.  2 04/10/2023   omeprazole (PRILOSEC) 40 MG capsule Take 1 capsule (40 mg total) by mouth in the morning 30 minutes before eating for reflux 90 capsule 4 04/10/2023   rosuvastatin (CRESTOR) 20 MG tablet Take 1 tablet (20 mg total) by mouth at bedtime. 90 tablet 4 04/10/2023   traMADol (ULTRAM) 50 MG tablet Take 1 tablet (50 mg total) by mouth every 8 (eight) hours as needed for severe pain. 12 tablet 0 04/10/2023   valsartan-hydrochlorothiazide (DIOVAN-HCT) 160-25 MG tablet Take 1 tablet by mouth daily.  2 04/11/2023 at 0600   valsartan-hydrochlorothiazide (DIOVAN-HCT) 160-25 MG tablet Take 1  tablet by mouth daily. 90 tablet 3 04/10/2023   vitamin B-12 (CYANOCOBALAMIN) 500 MCG tablet Take 500 mcg by mouth daily.   04/10/2023   vitamin C (ASCORBIC ACID) 500 MG tablet Take 500 mg by mouth daily.   04/10/2023   lovastatin (MEVACOR) 40 MG tablet TAKE 1 TABLET BY MOUTH EVERY NIGHT AT BEDTIME 90 tablet 3    omeprazole (PRILOSEC) 40 MG capsule TAKE 1 CAPSULE BY MOUTH ONCE DAILY 90 capsule 3      Allergies  Allergen Reactions   Iodine Itching   Feldene [Piroxicam] Rash    On hands (rash)     Past Medical History:  Diagnosis Date   Arthritis    Asthma    only URI   Hyperlipidemia     Hypertension     Review of systems:  Otherwise negative.    Physical Exam  Gen: Alert, oriented. Appears stated age.  HEENT: [place]/AT. PERRLA. Lungs: CTA, no wheezes. CV: RR nl S1, S2. Abd: soft, benign, no masses. BS+ Ext: No edema. Pulses 2+    Planned procedures: Proceed with colonoscopy. The patient understands the nature of the planned procedure, indications, risks, alternatives and potential complications including but not limited to bleeding, infection, perforation, damage to internal organs and possible oversedation/side effects from anesthesia. The patient agrees and gives consent to proceed.  Please refer to procedure notes for findings, recommendations and patient disposition/instructions.     Fareeha Evon K. Alyssa Baird, M.D. Gastroenterology 04/11/2023  12:33 PM

## 2023-04-11 NOTE — Anesthesia Postprocedure Evaluation (Signed)
Anesthesia Post Note  Patient: Modena Slater  Procedure(s) Performed: COLONOSCOPY  Patient location during evaluation: Endoscopy Anesthesia Type: General Level of consciousness: awake and alert Pain management: pain level controlled Vital Signs Assessment: post-procedure vital signs reviewed and stable Respiratory status: spontaneous breathing, nonlabored ventilation, respiratory function stable and patient connected to nasal cannula oxygen Cardiovascular status: blood pressure returned to baseline and stable Postop Assessment: no apparent nausea or vomiting Anesthetic complications: no   No notable events documented.   Last Vitals:  Vitals:   04/11/23 1310 04/11/23 1320  BP: (!) 141/69 (!) 191/74  Pulse:    Resp:    Temp:    SpO2:      Last Pain:  Vitals:   04/11/23 1320  TempSrc:   PainSc: 0-No pain                 Louie Boston

## 2023-04-11 NOTE — Op Note (Signed)
[facility] Gastroenterology Patient Name: Alyssa Baird Procedure Date: 04/11/2023 12:40 PM MRN: [id number] Account #: [id number] Date of Birth: 1945-12-13 Admit Type: Outpatient Age: 77 Room: [facility] ENDO ROOM 2 Gender: Female Note Status: Finalized Instrument Name: Colonoscope [id number] Procedure:             Colonoscopy Indications:           Positive Cologuard test Providers:             Boykin Nearing. Lukah Goswami MD, MD Medicines:             Propofol per Anesthesia Complications:         No immediate complications. Procedure:             Pre-Anesthesia Assessment:                        - The risks and benefits of the procedure and the                         sedation options and risks were discussed with the                         patient. All questions were answered and informed                         consent was obtained.                        - Patient identification and proposed procedure were                         verified prior to the procedure by the nurse. The                         procedure was verified in the procedure room.                        - ASA Grade Assessment: III - A patient with severe                         systemic disease.                        - After reviewing the risks and benefits, the patient                         was deemed in satisfactory condition to undergo the                         procedure.                        After obtaining informed consent, the colonoscope was                         passed under direct vision. Throughout the procedure,                         the patient's blood pressure, pulse, and oxygen  saturations were monitored continuously. The                         Colonoscope was introduced through the anus and                         advanced to the the cecum, identified by appendiceal                         orifice and ileocecal valve. The colonoscopy was                          performed without difficulty. The patient tolerated                         the procedure well. The quality of the bowel                         preparation was good. The ileocecal valve, appendiceal                         orifice, and rectum were photographed. Findings:      The perianal and digital rectal examinations were normal. Pertinent       negatives include normal sphincter tone and no palpable rectal lesions.      Non-bleeding internal hemorrhoids were found during retroflexion. The       hemorrhoids were Grade I (internal hemorrhoids that do not prolapse).      Many small-mouthed diverticula were found in the entire colon. There was       no evidence of diverticular bleeding.      The exam was otherwise without abnormality. Impression:            - Non-bleeding internal hemorrhoids.                        - Mild diverticulosis in the entire examined colon.                         There was no evidence of diverticular bleeding.                        - The examination was otherwise normal.                        - No specimens collected. Recommendation:        - Patient has a contact number available for                         emergencies. The signs and symptoms of potential                         delayed complications were discussed with the patient.                         Return to normal activities tomorrow. Written                         discharge instructions were provided to the patient.                        -  Resume previous diet.                        - Continue present medications.                        - No repeat colonoscopy due to current age (57 years                         or older) and the absence of colonic polyps.                        - You do NOT require further colon cancer screening                         measures (Annual stool testing (i.e. hemoccult, FIT,                         cologuard), sigmoidoscopy, colonoscopy or CT                          colonography). You should share this recommendation                         with your Primary Care provider.                        - Return to GI office PRN.                        - The findings and recommendations were discussed with                         the patient. Procedure Code(s):     --- Professional ---                        [id number], Colonoscopy, flexible; diagnostic, including                         collection of specimen(s) by brushing or washing, when                         performed (separate procedure) Diagnosis Code(s):     --- Professional ---                        K57.30, Diverticulosis of large intestine without                         perforation or abscess without bleeding                        R19.5, Other fecal abnormalities                        K64.0, First degree hemorrhoids CPT copyright 2022 [employer]. All rights reserved. The codes documented in this report are preliminary and upon coder review may  be revised to meet current compliance requirements. Stanton Kidney MD, MD 04/11/2023 1:00:56 PM This report has been signed electronically. Number of Addenda: 0 Note Initiated  On: 04/11/2023 12:40 PM Scope Withdrawal Time: 0 hours 6 minutes 9 seconds  Total Procedure Duration: 0 hours 9 minutes 54 seconds  Estimated Blood Loss:  Estimated blood loss: none.      [facility]

## 2023-04-11 NOTE — Transfer of Care (Signed)
Immediate Anesthesia Transfer of Care Note  Patient: Alyssa Baird  Procedure(s) Performed: COLONOSCOPY  Patient Location: Endoscopy Unit  Anesthesia Type:General  Level of Consciousness: drowsy  Airway & Oxygen Therapy: Patient Spontanous Breathing  Post-op Assessment: Report given to RN and Post -op Vital signs reviewed and stable  Post vital signs: Reviewed and stable  Last Vitals:  Vitals Value Taken Time  BP 98/54 04/11/23 1302  Temp 36.9 C 04/11/23 1300  Pulse 56 04/11/23 1302  Resp 13 04/11/23 1302  SpO2 99 % 04/11/23 1302  Vitals shown include unvalidated device data.  Last Pain:  Vitals:   04/11/23 1300  TempSrc: Temporal  PainSc: Asleep         Complications: No notable events documented.

## 2023-04-11 NOTE — Anesthesia Preprocedure Evaluation (Signed)
Anesthesia Evaluation  Patient identified by MRN, date of birth, ID band Patient awake    Reviewed: Allergy & Precautions, NPO status , Patient's Chart, lab work & pertinent test results  History of Anesthesia Complications Negative for: history of anesthetic complications  Airway Mallampati: III  TM Distance: >3 FB Neck ROM: full    Dental  (+) Chipped, Poor Dentition, Missing, Partial Upper   Pulmonary neg shortness of breath, asthma    Pulmonary exam normal        Cardiovascular Exercise Tolerance: Good hypertension, (-) angina Normal cardiovascular exam     Neuro/Psych negative neurological ROS  negative psych ROS   GI/Hepatic negative GI ROS, Neg liver ROS,,,  Endo/Other  negative endocrine ROS    Renal/GU negative Renal ROS  negative genitourinary   Musculoskeletal   Abdominal   Peds  Hematology negative hematology ROS (+)   Anesthesia Other Findings Past Medical History: No date: Arthritis No date: Asthma     Comment:  only URI No date: Hyperlipidemia No date: Hypertension  Past Surgical History: No date: ABDOMINAL HYSTERECTOMY No date: CHOLECYSTECTOMY 06/11/2018: SHOULDER ARTHROSCOPY WITH ROTATOR CUFF REPAIR; Right     Comment:  Procedure: SHOULDER ARTHROSCOPY WITH ROTATOR CUFF               REPAIR;  Surgeon: Lyndle Herrlich, MD;  Location: [facility]               ORS;  Service: Orthopedics;  Laterality: Right;  BMI    Body Mass Index: 27.60 kg/m      Reproductive/Obstetrics negative OB ROS                             Anesthesia Physical Anesthesia Plan  ASA: 3  Anesthesia Plan: General   Post-op Pain Management:    Induction: Intravenous  PONV Risk Score and Plan: Propofol infusion and TIVA  Airway Management Planned: Natural Airway and Nasal Cannula  Additional Equipment:   Intra-op Plan:   Post-operative Plan:   Informed Consent: I have reviewed the  patients History and Physical, chart, labs and discussed the procedure including the risks, benefits and alternatives for the proposed anesthesia with the patient or authorized representative who has indicated his/her understanding and acceptance.     Dental Advisory Given  Plan Discussed with: Anesthesiologist, CRNA and Surgeon  Anesthesia Plan Comments: (Patient consented for risks of anesthesia including but not limited to:  - adverse reactions to medications - risk of airway placement if required - damage to eyes, teeth, lips or other oral mucosa - nerve damage due to positioning  - sore throat or hoarseness - Damage to heart, brain, nerves, lungs, other parts of body or loss of life  Patient voiced understanding.)       Anesthesia Quick Evaluation

## 2023-04-12 ENCOUNTER — Encounter: Payer: Self-pay | Admitting: Internal Medicine

## 2023-05-02 ENCOUNTER — Other Ambulatory Visit: Payer: Self-pay

## 2023-06-18 ENCOUNTER — Ambulatory Visit
Admission: RE | Admit: 2023-06-18 | Discharge: 2023-06-18 | Disposition: A | Discharge status: Home / Self Care | Payer: Commercial Managed Care - PPO | Source: Ambulatory Visit | Attending: Nurse Practitioner | Admitting: Nurse Practitioner

## 2023-06-18 DIAGNOSIS — Z1231 Encounter for screening mammogram for malignant neoplasm of breast: Secondary | ICD-10-CM | POA: Insufficient documentation

## 2023-07-05 DIAGNOSIS — Z0130 Encounter for examination of blood pressure without abnormal findings: Secondary | ICD-10-CM | POA: Diagnosis not present

## 2023-07-05 DIAGNOSIS — Z1389 Encounter for screening for other disorder: Secondary | ICD-10-CM | POA: Diagnosis not present

## 2023-07-05 DIAGNOSIS — R7309 Other abnormal glucose: Secondary | ICD-10-CM | POA: Diagnosis not present

## 2023-07-05 DIAGNOSIS — Z0000 Encounter for general adult medical examination without abnormal findings: Secondary | ICD-10-CM | POA: Diagnosis not present

## 2023-07-05 DIAGNOSIS — Z0131 Encounter for examination of blood pressure with abnormal findings: Secondary | ICD-10-CM | POA: Diagnosis not present

## 2023-07-05 DIAGNOSIS — Z13820 Encounter for screening for osteoporosis: Secondary | ICD-10-CM | POA: Diagnosis not present

## 2023-07-05 DIAGNOSIS — N1831 Chronic kidney disease, stage 3a: Secondary | ICD-10-CM | POA: Diagnosis not present

## 2023-07-05 DIAGNOSIS — M542 Cervicalgia: Secondary | ICD-10-CM | POA: Diagnosis not present

## 2023-07-05 DIAGNOSIS — I10 Essential (primary) hypertension: Secondary | ICD-10-CM | POA: Diagnosis not present

## 2023-07-05 DIAGNOSIS — E871 Hypo-osmolality and hyponatremia: Secondary | ICD-10-CM | POA: Diagnosis not present

## 2023-07-10 ENCOUNTER — Other Ambulatory Visit: Payer: Self-pay | Admitting: Nurse Practitioner

## 2023-07-10 DIAGNOSIS — Z13820 Encounter for screening for osteoporosis: Secondary | ICD-10-CM

## 2023-07-10 DIAGNOSIS — Z780 Asymptomatic menopausal state: Secondary | ICD-10-CM

## 2023-10-11 ENCOUNTER — Other Ambulatory Visit: Payer: Self-pay

## 2023-10-11 ENCOUNTER — Ambulatory Visit
Admission: RE | Admit: 2023-10-11 | Discharge: 2023-10-11 | Disposition: A | Discharge status: Home / Self Care | Payer: Commercial Managed Care - PPO | Source: Ambulatory Visit | Attending: Nurse Practitioner | Admitting: Nurse Practitioner

## 2023-10-11 DIAGNOSIS — Z13820 Encounter for screening for osteoporosis: Secondary | ICD-10-CM | POA: Insufficient documentation

## 2023-10-11 DIAGNOSIS — Z780 Asymptomatic menopausal state: Secondary | ICD-10-CM | POA: Diagnosis not present

## 2023-10-12 ENCOUNTER — Other Ambulatory Visit: Payer: Self-pay

## 2023-10-12 DIAGNOSIS — M545 Low back pain, unspecified: Secondary | ICD-10-CM | POA: Diagnosis not present

## 2023-10-12 DIAGNOSIS — M62838 Other muscle spasm: Secondary | ICD-10-CM | POA: Diagnosis not present

## 2023-10-12 MED ORDER — CYCLOBENZAPRINE HCL 10 MG PO TABS
10.0000 mg | ORAL_TABLET | Freq: Three times a day (TID) | ORAL | 0 refills | Status: AC | PRN
  Filled 2023-10-12: qty 15, 5d supply, fill #0

## 2023-12-03 ENCOUNTER — Other Ambulatory Visit: Payer: Self-pay

## 2023-12-04 ENCOUNTER — Other Ambulatory Visit: Payer: Self-pay

## 2023-12-06 ENCOUNTER — Other Ambulatory Visit: Payer: Self-pay

## 2023-12-07 ENCOUNTER — Other Ambulatory Visit: Payer: Self-pay

## 2023-12-07 MED ORDER — AMLODIPINE BESYLATE 10 MG PO TABS
10.0000 mg | ORAL_TABLET | Freq: Every day | ORAL | 2 refills | Status: DC
  Filled 2023-12-07: qty 90, 90d supply, fill #0
  Filled 2024-03-05: qty 90, 90d supply, fill #1
  Filled 2024-06-10: qty 90, 90d supply, fill #2

## 2023-12-24 ENCOUNTER — Other Ambulatory Visit: Payer: Self-pay

## 2024-02-05 ENCOUNTER — Other Ambulatory Visit: Payer: Self-pay

## 2024-02-05 DIAGNOSIS — K047 Periapical abscess without sinus: Secondary | ICD-10-CM | POA: Diagnosis not present

## 2024-02-05 MED ORDER — CLINDAMYCIN HCL 300 MG PO CAPS
300.0000 mg | ORAL_CAPSULE | Freq: Three times a day (TID) | ORAL | 0 refills | Status: AC
  Filled 2024-02-05: qty 21, 7d supply, fill #0

## 2024-02-09 ENCOUNTER — Other Ambulatory Visit: Payer: Self-pay

## 2024-02-10 ENCOUNTER — Other Ambulatory Visit: Payer: Self-pay

## 2024-02-10 MED ORDER — OMEPRAZOLE 40 MG PO CPDR
40.0000 mg | DELAYED_RELEASE_CAPSULE | Freq: Every morning | ORAL | 4 refills | Status: AC
  Filled 2024-02-10: qty 90, 90d supply, fill #0
  Filled 2024-05-11: qty 90, 90d supply, fill #1
  Filled 2024-08-07: qty 90, 90d supply, fill #2
  Filled 2024-11-11: qty 90, 90d supply, fill #3

## 2024-02-10 MED ORDER — VALSARTAN-HYDROCHLOROTHIAZIDE 160-25 MG PO TABS
1.0000 | ORAL_TABLET | Freq: Every day | ORAL | 3 refills | Status: AC
  Filled 2024-02-10: qty 90, 90d supply, fill #0
  Filled 2024-05-11: qty 90, 90d supply, fill #1

## 2024-02-11 ENCOUNTER — Other Ambulatory Visit: Payer: Self-pay

## 2024-02-12 ENCOUNTER — Other Ambulatory Visit: Payer: Self-pay

## 2024-03-05 ENCOUNTER — Other Ambulatory Visit: Payer: Self-pay

## 2024-03-05 MED ORDER — ROSUVASTATIN CALCIUM 20 MG PO TABS
20.0000 mg | ORAL_TABLET | Freq: Every day | ORAL | 4 refills | Status: AC
  Filled 2024-03-05: qty 90, 90d supply, fill #0
  Filled 2024-06-10: qty 90, 90d supply, fill #1
  Filled 2024-09-08: qty 90, 90d supply, fill #2
  Filled 2024-12-08: qty 90, 90d supply, fill #3

## 2024-03-06 ENCOUNTER — Other Ambulatory Visit: Payer: Self-pay

## 2024-03-07 ENCOUNTER — Other Ambulatory Visit: Payer: Self-pay

## 2024-03-17 ENCOUNTER — Other Ambulatory Visit: Payer: Self-pay

## 2024-03-18 ENCOUNTER — Other Ambulatory Visit: Payer: Self-pay

## 2024-03-19 ENCOUNTER — Other Ambulatory Visit: Payer: Self-pay

## 2024-03-19 MED ORDER — METOPROLOL TARTRATE 50 MG PO TABS
50.0000 mg | ORAL_TABLET | Freq: Two times a day (BID) | ORAL | 3 refills | Status: AC
  Filled 2024-03-19: qty 180, 90d supply, fill #0
  Filled 2024-06-17: qty 180, 90d supply, fill #1
  Filled 2024-09-22: qty 180, 90d supply, fill #2
  Filled 2024-12-22: qty 180, 90d supply, fill #3

## 2024-04-13 DIAGNOSIS — K047 Periapical abscess without sinus: Secondary | ICD-10-CM | POA: Diagnosis not present

## 2024-05-09 ENCOUNTER — Encounter: Payer: Self-pay | Admitting: Nurse Practitioner

## 2024-05-30 DIAGNOSIS — R7309 Other abnormal glucose: Secondary | ICD-10-CM | POA: Diagnosis not present

## 2024-05-30 DIAGNOSIS — Z0000 Encounter for general adult medical examination without abnormal findings: Secondary | ICD-10-CM | POA: Diagnosis not present

## 2024-05-30 DIAGNOSIS — E871 Hypo-osmolality and hyponatremia: Secondary | ICD-10-CM | POA: Diagnosis not present

## 2024-05-30 DIAGNOSIS — N1831 Chronic kidney disease, stage 3a: Secondary | ICD-10-CM | POA: Diagnosis not present

## 2024-05-30 DIAGNOSIS — Z0130 Encounter for examination of blood pressure without abnormal findings: Secondary | ICD-10-CM | POA: Diagnosis not present

## 2024-05-30 DIAGNOSIS — Z23 Encounter for immunization: Secondary | ICD-10-CM | POA: Diagnosis not present

## 2024-05-30 DIAGNOSIS — I10 Essential (primary) hypertension: Secondary | ICD-10-CM | POA: Diagnosis not present

## 2024-05-30 DIAGNOSIS — Z0131 Encounter for examination of blood pressure with abnormal findings: Secondary | ICD-10-CM | POA: Diagnosis not present

## 2024-05-30 DIAGNOSIS — Z1389 Encounter for screening for other disorder: Secondary | ICD-10-CM | POA: Diagnosis not present

## 2024-05-31 ENCOUNTER — Other Ambulatory Visit: Payer: Self-pay

## 2024-05-31 MED ORDER — VALSARTAN-HYDROCHLOROTHIAZIDE 320-25 MG PO TABS
1.0000 | ORAL_TABLET | Freq: Every day | ORAL | 2 refills | Status: DC
  Filled 2024-05-31 – 2024-06-12 (×2): qty 30, 30d supply, fill #0
  Filled 2024-07-15: qty 30, 30d supply, fill #1
  Filled 2024-08-19: qty 30, 30d supply, fill #2

## 2024-06-01 ENCOUNTER — Other Ambulatory Visit: Payer: Self-pay

## 2024-06-02 ENCOUNTER — Other Ambulatory Visit: Payer: Self-pay | Admitting: Nurse Practitioner

## 2024-06-02 DIAGNOSIS — Z1231 Encounter for screening mammogram for malignant neoplasm of breast: Secondary | ICD-10-CM

## 2024-06-11 ENCOUNTER — Other Ambulatory Visit: Payer: Self-pay

## 2024-06-12 ENCOUNTER — Other Ambulatory Visit: Payer: Self-pay

## 2024-06-18 ENCOUNTER — Ambulatory Visit
Admission: RE | Admit: 2024-06-18 | Discharge: 2024-06-18 | Disposition: A | Discharge status: Home / Self Care | Source: Ambulatory Visit | Attending: Nurse Practitioner | Admitting: Nurse Practitioner

## 2024-06-18 DIAGNOSIS — Z1231 Encounter for screening mammogram for malignant neoplasm of breast: Secondary | ICD-10-CM | POA: Insufficient documentation

## 2024-08-06 ENCOUNTER — Other Ambulatory Visit: Payer: Self-pay

## 2024-08-06 MED ORDER — SHINGRIX 50 MCG/0.5ML IM SUSR
INTRAMUSCULAR | 0 refills | Status: AC
  Filled 2024-08-06: qty 0.5, 1d supply, fill #0

## 2024-08-12 DIAGNOSIS — I1A0 Resistant hypertension: Secondary | ICD-10-CM | POA: Diagnosis not present

## 2024-08-12 DIAGNOSIS — N182 Chronic kidney disease, stage 2 (mild): Secondary | ICD-10-CM | POA: Diagnosis not present

## 2024-08-18 ENCOUNTER — Other Ambulatory Visit: Payer: Self-pay

## 2024-08-18 MED ORDER — SPIRONOLACTONE 25 MG PO TABS
25.0000 mg | ORAL_TABLET | Freq: Every day | ORAL | 3 refills | Status: AC
  Filled 2024-08-18: qty 90, 90d supply, fill #0
  Filled 2024-11-11: qty 90, 90d supply, fill #1

## 2024-08-19 ENCOUNTER — Other Ambulatory Visit: Payer: Self-pay

## 2024-08-19 MED ORDER — FLUZONE HIGH-DOSE 0.5 ML IM SUSY
0.5000 mL | PREFILLED_SYRINGE | Freq: Once | INTRAMUSCULAR | 0 refills | Status: AC
  Filled 2024-08-19: qty 0.5, 1d supply, fill #0

## 2024-08-20 ENCOUNTER — Other Ambulatory Visit (HOSPITAL_COMMUNITY): Payer: Self-pay

## 2024-09-08 ENCOUNTER — Other Ambulatory Visit: Payer: Self-pay

## 2024-09-10 ENCOUNTER — Other Ambulatory Visit: Payer: Self-pay

## 2024-09-11 ENCOUNTER — Other Ambulatory Visit: Payer: Self-pay

## 2024-09-12 ENCOUNTER — Other Ambulatory Visit: Payer: Self-pay

## 2024-09-14 ENCOUNTER — Other Ambulatory Visit: Payer: Self-pay

## 2024-09-14 MED ORDER — AMLODIPINE BESYLATE 10 MG PO TABS
10.0000 mg | ORAL_TABLET | Freq: Every day | ORAL | 2 refills | Status: AC
  Filled 2024-09-14: qty 90, 90d supply, fill #0
  Filled 2024-12-16: qty 90, 90d supply, fill #1

## 2024-09-16 ENCOUNTER — Other Ambulatory Visit: Payer: Self-pay

## 2024-09-22 ENCOUNTER — Other Ambulatory Visit: Payer: Self-pay

## 2024-09-23 ENCOUNTER — Other Ambulatory Visit: Payer: Self-pay

## 2024-09-24 ENCOUNTER — Other Ambulatory Visit: Payer: Self-pay

## 2024-09-25 ENCOUNTER — Other Ambulatory Visit: Payer: Self-pay

## 2024-09-25 MED ORDER — VALSARTAN-HYDROCHLOROTHIAZIDE 320-25 MG PO TABS
1.0000 | ORAL_TABLET | Freq: Every day | ORAL | 2 refills | Status: AC
  Filled 2024-09-25: qty 90, 90d supply, fill #0

## 2024-09-26 ENCOUNTER — Other Ambulatory Visit: Payer: Self-pay

## 2024-09-29 ENCOUNTER — Other Ambulatory Visit: Payer: Self-pay

## 2024-10-02 ENCOUNTER — Other Ambulatory Visit: Payer: Self-pay

## 2024-10-02 MED ORDER — COMIRNATY 30 MCG/0.3ML IM SUSY
0.3000 mL | PREFILLED_SYRINGE | Freq: Once | INTRAMUSCULAR | 0 refills | Status: AC
  Filled 2024-10-02: qty 0.3, 1d supply, fill #0
